# Patient Record
Sex: Female | Born: 1980
Health system: Southern US, Community
[De-identification: ages and names within clinical notes are randomized; demographics above are authoritative.]

## PROBLEM LIST (undated history)

## (undated) DIAGNOSIS — F419 Anxiety disorder, unspecified: Secondary | ICD-10-CM

## (undated) DIAGNOSIS — R030 Elevated blood-pressure reading, without diagnosis of hypertension: Secondary | ICD-10-CM

## (undated) DIAGNOSIS — D649 Anemia, unspecified: Secondary | ICD-10-CM

## (undated) DIAGNOSIS — T4145XA Adverse effect of unspecified anesthetic, initial encounter: Secondary | ICD-10-CM

## (undated) DIAGNOSIS — T8859XA Other complications of anesthesia, initial encounter: Secondary | ICD-10-CM

## (undated) DIAGNOSIS — R87629 Unspecified abnormal cytological findings in specimens from vagina: Secondary | ICD-10-CM

## (undated) DIAGNOSIS — T884XXA Failed or difficult intubation, initial encounter: Secondary | ICD-10-CM

## (undated) DIAGNOSIS — I10 Essential (primary) hypertension: Secondary | ICD-10-CM

## (undated) DIAGNOSIS — K219 Gastro-esophageal reflux disease without esophagitis: Secondary | ICD-10-CM

## (undated) DIAGNOSIS — K432 Incisional hernia without obstruction or gangrene: Secondary | ICD-10-CM

## (undated) DIAGNOSIS — R011 Cardiac murmur, unspecified: Secondary | ICD-10-CM

## (undated) HISTORY — DX: Anxiety disorder, unspecified: F41.9

## (undated) HISTORY — DX: Anemia, unspecified: D64.9

## (undated) HISTORY — PX: OTHER SURGICAL HISTORY: SHX169

## (undated) HISTORY — PX: HERNIA REPAIR: SHX51

## (undated) HISTORY — PX: UTERINE FIBROID SURGERY: SHX826

## (undated) HISTORY — PX: TONSILLECTOMY: SUR1361

---
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
## 2000-09-07 ENCOUNTER — Emergency Department (HOSPITAL_COMMUNITY): Admission: EM | Admit: 2000-09-07 | Discharge: 2000-09-07 | Payer: Self-pay | Admitting: Emergency Medicine

## 2001-01-24 ENCOUNTER — Encounter: Payer: Self-pay | Admitting: Emergency Medicine

## 2001-01-24 ENCOUNTER — Emergency Department (HOSPITAL_COMMUNITY): Admission: EM | Admit: 2001-01-24 | Discharge: 2001-01-24 | Payer: Self-pay | Admitting: Emergency Medicine

## 2007-01-15 ENCOUNTER — Emergency Department: Payer: Self-pay | Admitting: Emergency Medicine

## 2010-10-24 ENCOUNTER — Emergency Department: Payer: Self-pay | Admitting: Emergency Medicine

## 2011-03-12 ENCOUNTER — Emergency Department: Payer: Self-pay | Admitting: Emergency Medicine

## 2012-01-19 ENCOUNTER — Emergency Department: Payer: Self-pay | Admitting: Emergency Medicine

## 2012-01-19 LAB — RAPID INFLUENZA A&B ANTIGENS

## 2012-06-22 ENCOUNTER — Emergency Department: Payer: Self-pay | Admitting: Emergency Medicine

## 2013-01-31 ENCOUNTER — Emergency Department: Payer: Self-pay | Admitting: Emergency Medicine

## 2013-01-31 LAB — CBC
HCT: 41.1 % (ref 35.0–47.0)
HGB: 13.4 g/dL (ref 12.0–16.0)
MCH: 27.4 pg (ref 26.0–34.0)
MCHC: 32.6 g/dL (ref 32.0–36.0)
MCV: 84 fL (ref 80–100)
Platelet: 200 10*3/uL (ref 150–440)
RBC: 4.89 10*6/uL (ref 3.80–5.20)
RDW: 13.9 % (ref 11.5–14.5)
WBC: 6 10*3/uL (ref 3.6–11.0)

## 2013-01-31 LAB — COMPREHENSIVE METABOLIC PANEL
Albumin: 3.6 g/dL (ref 3.4–5.0)
Alkaline Phosphatase: 59 U/L
Anion Gap: 5 — ABNORMAL LOW (ref 7–16)
BUN: 8 mg/dL (ref 7–18)
Bilirubin,Total: 0.4 mg/dL (ref 0.2–1.0)
Calcium, Total: 8.7 mg/dL (ref 8.5–10.1)
Chloride: 105 mmol/L (ref 98–107)
Co2: 27 mmol/L (ref 21–32)
Creatinine: 1 mg/dL (ref 0.60–1.30)
EGFR (African American): >60
EGFR (Non-African Amer.): >60
Glucose: 94 mg/dL (ref 65–99)
Osmolality: 272 (ref 275–301)
Potassium: 4 mmol/L (ref 3.5–5.1)
SGOT(AST): 39 U/L — ABNORMAL HIGH (ref 15–37)
SGPT (ALT): 40 U/L (ref 12–78)
Sodium: 137 mmol/L (ref 136–145)
Total Protein: 7.8 g/dL (ref 6.4–8.2)

## 2013-01-31 LAB — TROPONIN I: Troponin-I: <0.02 ng/mL

## 2013-01-31 LAB — CK TOTAL AND CKMB (NOT AT ARMC)
CK, Total: 160 U/L (ref 21–215)
CK-MB: 1.4 ng/mL (ref 0.5–3.6)

## 2013-07-08 ENCOUNTER — Emergency Department: Payer: Self-pay | Admitting: Emergency Medicine

## 2013-11-19 ENCOUNTER — Emergency Department: Payer: Self-pay | Admitting: Emergency Medicine

## 2013-11-19 LAB — CBC WITH DIFFERENTIAL/PLATELET
Basophil #: 0.1 10*3/uL (ref 0.0–0.1)
Basophil %: 1.1 %
Eosinophil #: 0.2 10*3/uL (ref 0.0–0.7)
Eosinophil %: 3.1 %
HCT: 35.4 % (ref 35.0–47.0)
HGB: 11 g/dL — ABNORMAL LOW (ref 12.0–16.0)
Lymphocyte #: 2.1 10*3/uL (ref 1.0–3.6)
Lymphocyte %: 38.4 %
MCH: 25.3 pg — ABNORMAL LOW (ref 26.0–34.0)
MCHC: 31.1 g/dL — ABNORMAL LOW (ref 32.0–36.0)
MCV: 81 fL (ref 80–100)
Monocyte #: 0.5 x10 3/mm (ref 0.2–0.9)
Monocyte %: 9.4 %
Neutrophil #: 2.7 10*3/uL (ref 1.4–6.5)
Neutrophil %: 48 %
Platelet: 250 10*3/uL (ref 150–440)
RBC: 4.35 10*6/uL (ref 3.80–5.20)
RDW: 15.1 % — ABNORMAL HIGH (ref 11.5–14.5)
WBC: 5.6 10*3/uL (ref 3.6–11.0)

## 2013-11-19 LAB — COMPREHENSIVE METABOLIC PANEL
Albumin: 3.4 g/dL (ref 3.4–5.0)
Alkaline Phosphatase: 50 U/L
Anion Gap: 6 — ABNORMAL LOW (ref 7–16)
BUN: 9 mg/dL (ref 7–18)
Bilirubin,Total: 0.3 mg/dL (ref 0.2–1.0)
Calcium, Total: 8.3 mg/dL — ABNORMAL LOW (ref 8.5–10.1)
Chloride: 108 mmol/L — ABNORMAL HIGH (ref 98–107)
Co2: 27 mmol/L (ref 21–32)
Creatinine: 1 mg/dL (ref 0.60–1.30)
EGFR (African American): >60
EGFR (Non-African Amer.): >60
Glucose: 109 mg/dL — ABNORMAL HIGH (ref 65–99)
Osmolality: 281 (ref 275–301)
Potassium: 4.3 mmol/L (ref 3.5–5.1)
SGOT(AST): 25 U/L (ref 15–37)
SGPT (ALT): 34 U/L
Sodium: 141 mmol/L (ref 136–145)
Total Protein: 7.3 g/dL (ref 6.4–8.2)

## 2013-11-19 LAB — URINALYSIS, COMPLETE
Bacteria: NONE SEEN
Bilirubin,UR: NEGATIVE
Blood: NEGATIVE
Glucose,UR: NEGATIVE mg/dL (ref 0–75)
Ketone: NEGATIVE
Leukocyte Esterase: NEGATIVE
Nitrite: NEGATIVE
Ph: 6 (ref 4.5–8.0)
Protein: NEGATIVE
RBC,UR: NONE SEEN /HPF (ref 0–5)
Specific Gravity: 1.001 (ref 1.003–1.030)
Squamous Epithelial: 1
WBC UR: NONE SEEN /HPF (ref 0–5)

## 2014-02-18 ENCOUNTER — Emergency Department: Payer: Self-pay | Admitting: Emergency Medicine

## 2014-03-28 ENCOUNTER — Emergency Department: Payer: Self-pay | Admitting: Emergency Medicine

## 2014-06-04 DIAGNOSIS — D251 Intramural leiomyoma of uterus: Secondary | ICD-10-CM | POA: Insufficient documentation

## 2014-12-31 ENCOUNTER — Encounter: Payer: Self-pay | Admitting: Emergency Medicine

## 2014-12-31 ENCOUNTER — Emergency Department: Payer: Self-pay

## 2014-12-31 ENCOUNTER — Emergency Department
Admission: EM | Admit: 2014-12-31 | Discharge: 2014-12-31 | Disposition: A | Discharge status: Home / Self Care | Payer: Self-pay | Attending: Emergency Medicine | Admitting: Emergency Medicine

## 2014-12-31 DIAGNOSIS — M25511 Pain in right shoulder: Secondary | ICD-10-CM

## 2014-12-31 DIAGNOSIS — W010XXA Fall on same level from slipping, tripping and stumbling without subsequent striking against object, initial encounter: Secondary | ICD-10-CM | POA: Insufficient documentation

## 2014-12-31 DIAGNOSIS — F17210 Nicotine dependence, cigarettes, uncomplicated: Secondary | ICD-10-CM | POA: Insufficient documentation

## 2014-12-31 DIAGNOSIS — Y998 Other external cause status: Secondary | ICD-10-CM | POA: Insufficient documentation

## 2014-12-31 DIAGNOSIS — S4991XA Unspecified injury of right shoulder and upper arm, initial encounter: Secondary | ICD-10-CM | POA: Insufficient documentation

## 2014-12-31 DIAGNOSIS — Y9289 Other specified places as the place of occurrence of the external cause: Secondary | ICD-10-CM | POA: Insufficient documentation

## 2014-12-31 DIAGNOSIS — Y9389 Activity, other specified: Secondary | ICD-10-CM | POA: Insufficient documentation

## 2014-12-31 MED ORDER — OXYCODONE-ACETAMINOPHEN 5-325 MG PO TABS: 1.0000 | ORAL_TABLET | ORAL | Status: DC | PRN

## 2014-12-31 MED ORDER — OXYCODONE-ACETAMINOPHEN 5-325 MG PO TABS
1.0000 | ORAL_TABLET | Freq: Once | ORAL | Status: DC
  Filled 2014-12-31: qty 1

## 2014-12-31 MED ORDER — IBUPROFEN 800 MG PO TABS
800.0000 mg | ORAL_TABLET | Freq: Once | ORAL | Status: AC
  Administered 2014-12-31: 800 mg via ORAL

## 2014-12-31 MED ORDER — IBUPROFEN 800 MG PO TABS
ORAL_TABLET | ORAL | Status: AC
  Administered 2014-12-31: 800 mg via ORAL
  Filled 2014-12-31: qty 1

## 2014-12-31 NOTE — ED Provider Notes (Signed)
[facility] Emergency Department Provider Note  ____________________________________________  Time seen: 6:45 AM  I have reviewed the triage vital signs and the nursing notes.   HISTORY  Chief Complaint No chief complaint on file.     HPI Laura Benton is a 34 y.o. female presents with history of accidental slip and fall at midnight last night resulting in immediate onset of right shoulder pain. Patient states "I felt it pop out and then back in again. Patient states that her current pain score is 7 out of 10 worse and with right shoulder movement. She denies any head injury no loss of consciousness.     Past medical history Uterine fibroid Right shoulder dislocation There are no active problems to display for this patient.   Past Surgical History  Procedure Laterality Date  . Right shoulder surgery    . Uterine fibroid surgery      Current Outpatient Rx  Name  Route  Sig  Dispense  Refill  . oxyCODONE-acetaminophen (PERCOCET/ROXICET) 5-325 MG tablet   Oral   Take 1 tablet by mouth every 4 (four) hours as needed for severe pain.   20 tablet   0     Allergies No known drug allergies No family history on file.  Social History Social History  Substance Use Topics  . Smoking status: Current Some Day Smoker    Types: Cigarettes  . Smokeless tobacco: None  . Alcohol Use: No    Review of Systems  Constitutional: Negative for fever. Eyes: Negative for visual changes. ENT: Negative for sore throat. Cardiovascular: Negative for chest pain. Respiratory: Negative for shortness of breath. Gastrointestinal: Negative for abdominal pain, vomiting and diarrhea. Genitourinary: Negative for dysuria. Musculoskeletal: Negative for back pain. Positive for right shoulder pain Skin: Negative for rash. Neurological: Negative for headaches, focal weakness or numbness.   10-point ROS otherwise  negative.  ____________________________________________   PHYSICAL EXAM:  VITAL SIGNS: ED Triage Vitals  Enc Vitals Group     BP 12/31/14 0514 131/88 mmHg     Pulse Rate 12/31/14 0514 88     Resp 12/31/14 0514 20     Temp 12/31/14 0514 97.9 F (36.6 C)     Temp Source 12/31/14 0514 Oral     SpO2 12/31/14 0514 97 %     Weight 12/31/14 0514 235 lb (106.595 kg)     Height 12/31/14 0514 5\' 5"  (1.651 m)     Head Cir --      Peak Flow --      Pain Score 12/31/14 0512 5     Pain Loc --      Pain Edu? --      Excl. in GC? --      Constitutional: Alert and oriented. Apparent discomfort Eyes: Conjunctivae are normal. PERRL. Normal extraocular movements. ENT   Head: Normocephalic and atraumatic.   Nose: No congestion/rhinnorhea.   Mouth/Throat: Mucous membranes are moist.   Neck: No stridor. Hematological/Lymphatic/Immunilogical: No cervical lymphadenopathy. Cardiovascular: Normal rate, regular rhythm. Normal and symmetric distal pulses are present in all extremities. No murmurs, rubs, or gallops. Respiratory: Normal respiratory effort without tachypnea nor retractions. Breath sounds are clear and equal bilaterally. No wheezes/rales/rhonchi. Gastrointestinal: Soft and nontender. No distention. There is no CVA tenderness. Genitourinary: deferred Musculoskeletal: Pain with active and passive ROM. No joint effusions.  No lower extremity tenderness nor edema. Neurologic:  Normal speech and language. No gross focal neurologic deficits are appreciated. Speech is normal.  Skin:  Skin is  warm, dry and intact. No rash noted. Psychiatric: Mood and affect are normal. Speech and behavior are normal. Patient exhibits appropriate insight and judgment.    RADIOLOGY     DG Shoulder Right (Final result) Result time: 12/31/14 06:22:43   Final result by Rad Results In Interface (12/31/14 06:22:43)   Narrative:   CLINICAL DATA: Initial evaluation for acute trauma,  fall.  EXAM: RIGHT SHOULDER - 2+ VIEW  COMPARISON: None.  FINDINGS: No acute fracture or dislocation. Humeral head in normal alignment with the glenoid. AC joint approximated. Mild flattening at the superolateral aspect of the humeral head, which may related to prior dislocation. No periarticular calcification. Soft tissues within normal limits.  IMPRESSION: No acute fracture or dislocation.   Electronically Signed By: Rise Mu M.D. On: 12/31/2014 06:22        ECG Results       INITIAL IMPRESSION / ASSESSMENT AND PLAN / ED COURSE  Pertinent labs & imaging results that were available during my care of the patient were reviewed by me and considered in my medical decision making (see chart for details).  Concern for possible rotator cuff injury as such patient is being referred to Dr. Hyacinth Meeker orthopedic surgeon on-call for further evaluation and management on the outpatient setting  ____________________________________________   FINAL CLINICAL IMPRESSION(S) / ED DIAGNOSES  Final diagnoses:  Acute shoulder pain, right      Darci Current, MD 12/31/14 2312

## 2014-12-31 NOTE — Discharge Instructions (Signed)
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
## 2015-04-07 ENCOUNTER — Encounter: Payer: Self-pay | Admitting: Medical Oncology

## 2015-04-07 ENCOUNTER — Emergency Department
Admission: EM | Admit: 2015-04-07 | Discharge: 2015-04-07 | Disposition: A | Discharge status: Home / Self Care | Payer: Self-pay | Attending: Emergency Medicine | Admitting: Emergency Medicine

## 2015-04-07 ENCOUNTER — Emergency Department: Payer: Self-pay

## 2015-04-07 DIAGNOSIS — F17210 Nicotine dependence, cigarettes, uncomplicated: Secondary | ICD-10-CM | POA: Insufficient documentation

## 2015-04-07 DIAGNOSIS — R112 Nausea with vomiting, unspecified: Secondary | ICD-10-CM | POA: Insufficient documentation

## 2015-04-07 DIAGNOSIS — R1031 Right lower quadrant pain: Secondary | ICD-10-CM | POA: Insufficient documentation

## 2015-04-07 DIAGNOSIS — Z3202 Encounter for pregnancy test, result negative: Secondary | ICD-10-CM | POA: Insufficient documentation

## 2015-04-07 DIAGNOSIS — N939 Abnormal uterine and vaginal bleeding, unspecified: Secondary | ICD-10-CM | POA: Insufficient documentation

## 2015-04-07 LAB — CBC
HEMATOCRIT: 40.2 % (ref 35.0–47.0)
Hemoglobin: 13 g/dL (ref 12.0–16.0)
MCH: 27.3 pg (ref 26.0–34.0)
MCHC: 32.4 g/dL (ref 32.0–36.0)
MCV: 84.3 fL (ref 80.0–100.0)
Platelets: 253 10*3/uL (ref 150–440)
RBC: 4.76 MIL/uL (ref 3.80–5.20)
RDW: 16.5 % — AB (ref 11.5–14.5)
WBC: 5.8 10*3/uL (ref 3.6–11.0)

## 2015-04-07 LAB — URINALYSIS COMPLETE WITH MICROSCOPIC (ARMC ONLY)
BACTERIA UA: NONE SEEN
Bilirubin Urine: NEGATIVE
GLUCOSE, UA: NEGATIVE mg/dL
KETONES UR: NEGATIVE mg/dL
LEUKOCYTES UA: NEGATIVE
NITRITE: NEGATIVE
PROTEIN: 100 mg/dL — AB
RBC / HPF: NONE SEEN RBC/hpf (ref 0–5)
SPECIFIC GRAVITY, URINE: 1.011 (ref 1.005–1.030)
pH: 6 (ref 5.0–8.0)

## 2015-04-07 LAB — COMPREHENSIVE METABOLIC PANEL
ALK PHOS: 46 U/L (ref 38–126)
ALT: 24 U/L (ref 14–54)
ANION GAP: 7 (ref 5–15)
AST: 35 U/L (ref 15–41)
Albumin: 3.9 g/dL (ref 3.5–5.0)
BILIRUBIN TOTAL: 0.6 mg/dL (ref 0.3–1.2)
BUN: 12 mg/dL (ref 6–20)
CO2: 26 mmol/L (ref 22–32)
Calcium: 9.3 mg/dL (ref 8.9–10.3)
Chloride: 103 mmol/L (ref 101–111)
Creatinine, Ser: 1.02 mg/dL — ABNORMAL HIGH (ref 0.44–1.00)
GFR calc Af Amer: >60 mL/min (ref >60)
Glucose, Bld: 103 mg/dL — ABNORMAL HIGH (ref 65–99)
POTASSIUM: 4 mmol/L (ref 3.5–5.1)
Sodium: 136 mmol/L (ref 135–145)
TOTAL PROTEIN: 7.1 g/dL (ref 6.5–8.1)

## 2015-04-07 LAB — LIPASE, BLOOD: Lipase: 19 U/L (ref 11–51)

## 2015-04-07 LAB — POCT PREGNANCY, URINE: Preg Test, Ur: NEGATIVE

## 2015-04-07 MED ORDER — MORPHINE SULFATE (PF) 4 MG/ML IV SOLN
INTRAVENOUS | Status: AC
  Filled 2015-04-07: qty 1

## 2015-04-07 MED ORDER — ONDANSETRON HCL 4 MG/2ML IJ SOLN
INTRAMUSCULAR | Status: AC
  Filled 2015-04-07: qty 2

## 2015-04-07 MED ORDER — MORPHINE SULFATE (PF) 4 MG/ML IV SOLN
4.0000 mg | Freq: Once | INTRAVENOUS | Status: AC
  Administered 2015-04-07: 4 mg via INTRAVENOUS

## 2015-04-07 MED ORDER — ONDANSETRON HCL 4 MG/2ML IJ SOLN
4.0000 mg | Freq: Once | INTRAMUSCULAR | Status: AC
  Administered 2015-04-07: 4 mg via INTRAVENOUS

## 2015-04-07 MED ORDER — DICYCLOMINE HCL 20 MG PO TABS: 20.0000 mg | ORAL_TABLET | Freq: Three times a day (TID) | ORAL | Status: DC | PRN

## 2015-04-07 MED ORDER — IOHEXOL 240 MG/ML SOLN
25.0000 mL | Freq: Once | INTRAMUSCULAR | Status: AC | PRN
  Administered 2015-04-07: 25 mL via ORAL
  Filled 2015-04-07: qty 25

## 2015-04-07 MED ORDER — IOHEXOL 350 MG/ML SOLN
125.0000 mL | Freq: Once | INTRAVENOUS | Status: AC | PRN
  Administered 2015-04-07: 125 mL via INTRAVENOUS
  Filled 2015-04-07: qty 125

## 2015-04-07 MED ORDER — ONDANSETRON HCL 4 MG PO TABS: 4.0000 mg | ORAL_TABLET | Freq: Every day | ORAL | Status: DC | PRN

## 2015-04-07 MED ORDER — SODIUM CHLORIDE 0.9 % IV BOLUS (SEPSIS)
1000.0000 mL | Freq: Once | INTRAVENOUS | Status: AC
  Administered 2015-04-07: 1000 mL via INTRAVENOUS

## 2015-04-07 NOTE — ED Notes (Signed)
Patient with no complaints at this time. Respirations even and unlabored. Skin warm/dry. Discharge instructions reviewed with patient at this time. Patient given opportunity to voice concerns/ask questions. IV removed per policy and band-aid applied to site. Patient discharged at this time and left Emergency Department with steady gait.  

## 2015-04-07 NOTE — ED Notes (Signed)
Pt c/o rt lower abd pain that began 1 week ago, has continued to worsen, pt denies dysuria. Pt reports vomit x 1 but no diarrhea. Pt also denies any abnormal vaginal bleeding.

## 2015-04-07 NOTE — ED Provider Notes (Signed)
[facility] Emergency Department Provider Note  ____________________________________________  Time seen: Approximately 645 PM  I have reviewed the triage vital signs and the nursing notes.   HISTORY  Chief Complaint Abdominal Pain    HPI Laura Benton is a 35 y.o. female with right lower quadrant abdominal pain which is been increasing over the past week. The pain is 5 out of 10 at this time. It is been associated with nausea and vomiting. The patient describes it as sharp and radiating to her anterior right leg. She denies any burning or frequency with urination. Denies any diarrhea. Denies any vaginal discharge. Says that she is finishing her menses and is therefore having a small amount of vaginal bleeding at this time.   History reviewed. No pertinent past medical history.  There are no active problems to display for this patient.   Past Surgical History  Procedure Laterality Date  . Right shoulder surgery    . Uterine fibroid surgery      Current Outpatient Rx  Name  Route  Sig  Dispense  Refill  . oxyCODONE-acetaminophen (PERCOCET/ROXICET) 5-325 MG tablet   Oral   Take 1 tablet by mouth every 4 (four) hours as needed for severe pain.   20 tablet   0     Allergies Review of patient's allergies indicates no known allergies.  No family history on file.  Social History Social History  Substance Use Topics  . Smoking status: Current Some Day Smoker    Types: Cigarettes  . Smokeless tobacco: None  . Alcohol Use: No    Review of Systems Constitutional: No fever/chills Eyes: No visual changes. ENT: No sore throat. Cardiovascular: Denies chest pain. Respiratory: Denies shortness of breath. Gastrointestinal:  No diarrhea.  No constipation. Genitourinary: Negative for dysuria. Musculoskeletal: Negative for back pain. Skin: Negative for rash. Neurological: Negative for headaches, focal weakness or numbness.  10-point ROS  otherwise negative.  ____________________________________________   PHYSICAL EXAM:  VITAL SIGNS: ED Triage Vitals  Enc Vitals Group     BP 04/07/15 1726 178/105 mmHg     Pulse Rate 04/07/15 1726 88     Resp 04/07/15 1726 18     Temp 04/07/15 1726 98.2 F (36.8 C)     Temp Source 04/07/15 1726 Oral     SpO2 04/07/15 1726 99 %     Weight 04/07/15 1726 245 lb (111.131 kg)     Height 04/07/15 1726  (1.651 m)     Head Cir --      Peak Flow --      Pain Score 04/07/15 1731 5     Pain Loc --      Pain Edu? --      Excl. in GC? --     Constitutional: Alert and oriented. Well appearing and in no acute distress. Eyes: Conjunctivae are normal. PERRL. EOMI. Head: Atraumatic. Nose: No congestion/rhinnorhea. Mouth/Throat: Mucous membranes are moist.  Neck: No stridor.   Cardiovascular: Normal rate, regular rhythm. Grossly normal heart sounds.  Good peripheral circulation. Respiratory: Normal respiratory effort.  No retractions. Lungs CTAB. Gastrointestinal: Soft with mild-to-moderate right lower quadrant abdominal tenderness. There is no rebound or guarding. No mass along the inguinal right ligament. No distention. No abdominal bruits. No CVA tenderness. Musculoskeletal: No lower extremity tenderness nor edema.  No joint effusions. Neurologic:  Normal speech and language. No gross focal neurologic deficits are appreciated. No gait instability. Skin:  Skin is warm, dry and intact. No rash noted.  Psychiatric: Mood and affect are normal. Speech and behavior are normal.  ____________________________________________   LABS (all labs ordered are listed, but only abnormal results are displayed)  Labs Reviewed  COMPREHENSIVE METABOLIC PANEL - Abnormal; Notable for the following:    Glucose, Bld 103 (*)    Creatinine, Ser 1.02 (*)    All other components within normal limits  CBC - Abnormal; Notable for the following:    RDW 16.5 (*)    All other components within normal limits   URINALYSIS COMPLETEWITH MICROSCOPIC ([facility] ONLY) - Abnormal; Notable for the following:    Color, Urine YELLOW (*)    APPearance CLEAR (*)    Hgb urine dipstick 2+ (*)    Protein, ur 100 (*)    Squamous Epithelial / LPF 0-5 (*)    All other components within normal limits  LIPASE, BLOOD  POC URINE PREG, ED  POCT PREGNANCY, URINE   ____________________________________________  EKG   ____________________________________________  RADIOLOGY  IMPRESSION: 1. No acute findings. No bowel obstruction. No free fluid. Adnexal regions are unremarkable. Appendix is normal. 2. Probable fatty infiltration of the liver.   Electronically Signed By: Bary RichardStan Maynard M.D. On: 04/07/2015 19:48       ____________________________________________   PROCEDURES    ____________________________________________   INITIAL IMPRESSION / ASSESSMENT AND PLAN / ED COURSE  Pertinent labs & imaging results that were available during my care of the patient were reviewed by me and considered in my medical decision making (see chart for details).  ----------------------------------------- 8:14 PM on 04/07/2015 -----------------------------------------  Patient with pain and nausea improved after medication. Reviewed labs as well as imaging with the patient as well as her husband who is at bedside. The patient was specifically concerned about her appendix as well as fibroids which she has had removed in the past. We reviewed the CAT scan report in detail. It is possible this is been caused by virus. The patient also reported that she drinks heavily. She says she does not know how many mixed drinks she drinks per day. Despite this, her liver labs appear normal. We did discuss reducing her amount of drinking and how this may help with her overall health and the long-term. She understands this and is willing to comply. Will follow-up with her family physician in [place] within one week. Nose return  for any worsening or concerning symptoms. We did discuss return precautions. ____________________________________________   FINAL CLINICAL IMPRESSION(S) / ED DIAGNOSES  Right lower quadrant abdominal pain. Vomiting and nausea.    Myrna Blazeravid Matthew Marni Franzoni, MD 04/07/15 2015

## 2015-04-07 NOTE — Discharge Instructions (Signed)
Abdominal Pain, Adult °Many things can cause abdominal pain. Usually, abdominal pain is not caused by a disease and will improve without treatment. It can often be observed and treated at home. Your health care provider will do a physical exam and possibly order blood tests and X-rays to help determine the seriousness of your pain. However, in many cases, more time must pass before a clear cause of the pain can be found. Before that point, your health care provider may not know if you need more testing or further treatment. °HOME CARE INSTRUCTIONS °Monitor your abdominal pain for any changes. The following actions may help to alleviate any discomfort you are experiencing: °· Only take over-the-counter or prescription medicines as directed by your health care provider. °· Do not take laxatives unless directed to do so by your health care provider. °· Try a clear liquid diet (broth, tea, or water) as directed by your health care provider. Slowly move to a bland diet as tolerated. °SEEK MEDICAL CARE IF: °· You have unexplained abdominal pain. °· You have abdominal pain associated with nausea or diarrhea. °· You have pain when you urinate or have a bowel movement. °· You experience abdominal pain that wakes you in the night. °· You have abdominal pain that is worsened or improved by eating food. °· You have abdominal pain that is worsened with eating fatty foods. °· You have a fever. °SEEK IMMEDIATE MEDICAL CARE IF: °· Your pain does not go away within 2 hours. °· You keep throwing up (vomiting). °· Your pain is felt only in portions of the abdomen, such as the right side or the left lower portion of the abdomen. °· You pass bloody or black tarry stools. °MAKE SURE YOU: °· Understand these instructions. °· Will watch your condition. °· Will get help right away if you are not doing well or get worse. °  °This information is not intended to replace advice given to you by your health care provider. Make sure you discuss  any questions you have with your health care provider. °  °Document Released: 10/21/2004 Document Revised: 10/02/2014 Document Reviewed: 09/20/2012 °Elsevier Interactive Patient Education ©2016 [employer] ° °Nausea and Vomiting °Nausea means you feel sick to your stomach. Throwing up (vomiting) is a reflex where stomach contents come out of your mouth. °HOME CARE  °· Take medicine as told by your doctor. °· Do not force yourself to eat. However, you do need to drink fluids. °· If you feel like eating, eat a normal diet as told by your doctor. °¨ Eat rice, wheat, potatoes, bread, lean meats, yogurt, fruits, and vegetables. °¨ Avoid high-fat foods. °· Drink enough fluids to keep your pee (urine) clear or pale yellow. °· Ask your doctor how to replace body fluid losses (rehydrate). Signs of body fluid loss (dehydration) include: °¨ Feeling very thirsty. °¨ Dry lips and mouth. °¨ Feeling dizzy. °¨ Dark pee. °¨ Peeing less than normal. °¨ Feeling confused. °¨ Fast breathing or heart rate. °GET HELP RIGHT AWAY IF:  °· You have blood in your throw up. °· You have black or bloody poop (stool). °· You have a bad headache or stiff neck. °· You feel confused. °· You have bad belly (abdominal) pain. °· You have chest pain or trouble breathing. °· You do not pee at least once every 8 hours. °· You have cold, clammy skin. °· You keep throwing up after 24 to 48 hours. °· You have a fever. °MAKE SURE YOU:  °·   Understand these instructions. °· Will watch your condition. °· Will get help right away if you are not doing well or get worse. °  °This information is not intended to replace advice given to you by your health care provider. Make sure you discuss any questions you have with your health care provider. °  °Document Released: 06/30/2007 Document Revised: 04/05/2011 Document Reviewed: 06/12/2010 °Elsevier Interactive Patient Education ©2016 [employer] Inc[employer] ° °

## 2017-02-03 ENCOUNTER — Emergency Department
Admission: EM | Admit: 2017-02-03 | Discharge: 2017-02-03 | Disposition: A | Discharge status: Home / Self Care | Payer: Medicaid Other | Attending: Emergency Medicine | Admitting: Emergency Medicine

## 2017-02-03 ENCOUNTER — Emergency Department: Payer: Medicaid Other

## 2017-02-03 ENCOUNTER — Encounter: Payer: Self-pay | Admitting: Emergency Medicine

## 2017-02-03 DIAGNOSIS — Y999 Unspecified external cause status: Secondary | ICD-10-CM | POA: Diagnosis not present

## 2017-02-03 DIAGNOSIS — Y939 Activity, unspecified: Secondary | ICD-10-CM | POA: Diagnosis not present

## 2017-02-03 DIAGNOSIS — Y929 Unspecified place or not applicable: Secondary | ICD-10-CM | POA: Insufficient documentation

## 2017-02-03 DIAGNOSIS — Z3400 Encounter for supervision of normal first pregnancy, unspecified trimester: Secondary | ICD-10-CM | POA: Insufficient documentation

## 2017-02-03 DIAGNOSIS — S63259A Unspecified dislocation of unspecified finger, initial encounter: Secondary | ICD-10-CM

## 2017-02-03 DIAGNOSIS — Z331 Pregnant state, incidental: Secondary | ICD-10-CM | POA: Diagnosis not present

## 2017-02-03 DIAGNOSIS — S63282A Dislocation of proximal interphalangeal joint of right middle finger, initial encounter: Secondary | ICD-10-CM | POA: Insufficient documentation

## 2017-02-03 DIAGNOSIS — S63252A Unspecified dislocation of right middle finger, initial encounter: Secondary | ICD-10-CM | POA: Diagnosis present

## 2017-02-03 LAB — HCG, QUANTITATIVE, PREGNANCY: hCG, Beta Chain, Quant, S: 111264 m[IU]/mL — ABNORMAL HIGH (ref <5)

## 2017-02-03 LAB — POCT PREGNANCY, URINE: PREG TEST UR: POSITIVE — AB

## 2017-02-03 MED ORDER — LIDOCAINE HCL (PF) 1 % IJ SOLN
INTRAMUSCULAR | Status: AC
  Administered 2017-02-03: 5 mL via INTRADERMAL
  Filled 2017-02-03: qty 5

## 2017-02-03 MED ORDER — LIDOCAINE HCL 1 % IJ SOLN
5.0000 mL | Freq: Once | INTRAMUSCULAR | Status: AC
  Administered 2017-02-03: 5 mL via INTRADERMAL
  Filled 2017-02-03: qty 5
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
## 2017-06-10 ENCOUNTER — Encounter: Payer: Self-pay | Admitting: *Deleted

## 2017-06-10 ENCOUNTER — Observation Stay
Admission: EM | Admit: 2017-06-10 | Discharge: 2017-06-10 | Disposition: A | Discharge status: Home / Self Care | Payer: Medicaid Other | Attending: Certified Nurse Midwife | Admitting: Certified Nurse Midwife

## 2017-06-10 DIAGNOSIS — Z3A31 31 weeks gestation of pregnancy: Secondary | ICD-10-CM | POA: Insufficient documentation

## 2017-06-10 DIAGNOSIS — Z87891 Personal history of nicotine dependence: Secondary | ICD-10-CM | POA: Insufficient documentation

## 2017-06-10 DIAGNOSIS — R109 Unspecified abdominal pain: Secondary | ICD-10-CM | POA: Diagnosis not present

## 2017-06-10 DIAGNOSIS — O26893 Other specified pregnancy related conditions, third trimester: Principal | ICD-10-CM | POA: Insufficient documentation

## 2017-06-10 DIAGNOSIS — Z79899 Other long term (current) drug therapy: Secondary | ICD-10-CM | POA: Insufficient documentation

## 2017-06-10 HISTORY — DX: Unspecified abnormal cytological findings in specimens from vagina: R87.629

## 2017-06-10 LAB — URINALYSIS, COMPLETE (UACMP) WITH MICROSCOPIC
BACTERIA UA: NONE SEEN
Bilirubin Urine: NEGATIVE
Glucose, UA: NEGATIVE mg/dL
Hgb urine dipstick: NEGATIVE
Ketones, ur: NEGATIVE mg/dL
Leukocytes, UA: NEGATIVE
Nitrite: NEGATIVE
PH: 6 (ref 5.0–8.0)
Protein, ur: NEGATIVE mg/dL
SPECIFIC GRAVITY, URINE: 1.018 (ref 1.005–1.030)

## 2017-06-10 MED ORDER — ACETAMINOPHEN 325 MG PO TABS: 650.0000 mg | ORAL_TABLET | ORAL | Status: DC | PRN

## 2017-06-10 NOTE — Discharge Summary (Signed)
Laura Benton Shingles is a 37 y.o. female. She is at [redacted]w[redacted]d gestation. Patient's last menstrual period was 10/27/2016 (approximate). Estimated Date of Delivery: 08/06/17  Prenatal care site: [facility]  Chief complaint: abdominal pain  Location: central abdomen, near umbilicus Onset/timing: 0500 this morning Duration: constant Quality: cramping Severity: 5/10 Aggravating or alleviating conditions: none Associated signs/symptoms: mild nausea, no emesis Context: Laura Benton reports a cramping abdominal pain that woke her up this morning around 5am. She describes it as feeling like a menstrual cramp. Nothing seemed to make it better or worse and she did not do anything to try and treat the pain at home. She had been feeling somewhat nauseous when the pain first started, but she did not throw up and she is not feeling nauseous anymore. She reports no dysuria, no flank or back pain, and no urinary urgency.   S: Resting comfortably.   She reports:  -active fetal movement -no leakage of fluid  -no vaginal bleeding -no contractions  Maternal Medical History:   Past Medical History:  Diagnosis Date  . Vaginal Pap smear, abnormal     Past Surgical History:  Procedure Laterality Date  . right shoulder surgery    . UTERINE FIBROID SURGERY      No Known Allergies  Prior to Admission medications   Medication Sig Start Date End Date Taking? Authorizing Provider  ferrous sulfate 325 (65 FE) MG EC tablet Take 325 mg by mouth 3 (three) times daily with meals.   Yes [provider]  Prenatal Vit-Fe Fumarate-FA (PRENATAL MULTIVITAMIN) TABS tablet Take 1 tablet by mouth daily at 12 noon.   Yes [provider]  dicyclomine (BENTYL) 20 MG tablet Take 1 tablet (20 mg total) by mouth 3 (three) times daily as needed for spasms. 04/07/15 04/06/16  Myrna Blazer, MD  ondansetron (ZOFRAN) 4 MG tablet Take 1 tablet (4 mg total) by mouth daily as needed. 04/07/15   Schaevitz, Myra Rude, MD  oxyCODONE-acetaminophen (PERCOCET/ROXICET) 5-325 MG tablet Take 1 tablet by mouth every 4 (four) hours as needed for severe pain. 12/31/14   Darci Current, MD     Social History: She  reports that she has quit smoking. Her smoking use included cigarettes. She has never used smokeless tobacco. She reports that she does not drink alcohol or use drugs.  Family History: family history includes Anemia in her sister; Pulmonary disease in her son; Throat cancer in her father.   Review of Systems: A full review of systems was performed and negative except as noted in the HPI.    O:  BP 122/67 (BP Location: Right Arm)   Pulse 81   Temp 97.9 F (36.6 C) (Oral)   Resp 18   Ht  (1.626 m)   Wt 110.2 kg (243 lb)   LMP 10/27/2016 (Approximate)   BMI 41.71 kg/m  No results found for this or any previous visit (from the past 48 hour(s)).   Constitutional: NAD, AAOx3  HE/ENT: extraocular movements grossly intact, moist mucous membranes CV: RRR PULM: nl respiratory effort, CTABL     Abd: gravid, non-tender, non-distended, soft  Back: symmetric, no CVAT     Ext: Non-tender, Nonedmeatous   Psych: mood appropriate, speech normal Pelvic deferred  UA: negative nitrites, negative leukocytes, negative bacteria  NST:  Baseline: 140bpm Variability: moderate Accelerations: present x >2, 10x10 and 15x15 Decelerations: absent Time:  A/P: 37 y.o. [redacted]w[redacted]d here for antenatal surveillance for abdominal pain  Labor: not present.  Fetal Wellbeing: Reactive NST, reassuring for gestational age.   UA negative.  Reviewed Braxton Hicks contractions, labor contractions, and musculoskeletal discomforts of pregnancy. Advised hydration, Tylenol PRN, and pregnancy support band for musculoskeletal discomforts.   Next appointment 06/23/17 at [facility].  D/c home stable, precautions reviewed, follow-up as scheduled.    Genia Del 06/10/2017 7:31 AM  ----- Genia Del,  CNM Certified Nurse Midwife [facility], Department of OB/GYN [facility]

## 2017-06-10 NOTE — OB Triage Note (Signed)
Discharged home. Sig other with patient. Next OB appt @ [facility] 06/23/17. Number to [facility] given per CNM request should pt have questions/concerns. Elaina Hoops

## 2017-06-10 NOTE — OB Triage Note (Signed)
Pt arrived from home with complaints of middle abdominal pain that pt states feels like a cramp that started around 5 am. Pt also reports some nausea. Pt denies any leaking of fluid or vaginal bleeding. Pt reports positive fetal movement.

## 2017-06-10 NOTE — Discharge Instructions (Signed)

## 2017-10-29 ENCOUNTER — Encounter (HOSPITAL_COMMUNITY): Payer: Self-pay

## 2017-11-04 ENCOUNTER — Emergency Department: Payer: Medicaid Other

## 2017-11-04 ENCOUNTER — Other Ambulatory Visit: Payer: Self-pay

## 2017-11-04 ENCOUNTER — Emergency Department
Admission: EM | Admit: 2017-11-04 | Discharge: 2017-11-04 | Disposition: A | Discharge status: Home / Self Care | Payer: Medicaid Other | Attending: Emergency Medicine | Admitting: Emergency Medicine

## 2017-11-04 DIAGNOSIS — R112 Nausea with vomiting, unspecified: Secondary | ICD-10-CM | POA: Insufficient documentation

## 2017-11-04 DIAGNOSIS — Z87891 Personal history of nicotine dependence: Secondary | ICD-10-CM | POA: Diagnosis not present

## 2017-11-04 DIAGNOSIS — K439 Ventral hernia without obstruction or gangrene: Secondary | ICD-10-CM

## 2017-11-04 DIAGNOSIS — R109 Unspecified abdominal pain: Secondary | ICD-10-CM | POA: Diagnosis present

## 2017-11-04 DIAGNOSIS — R197 Diarrhea, unspecified: Secondary | ICD-10-CM | POA: Diagnosis not present

## 2017-11-04 DIAGNOSIS — K529 Noninfective gastroenteritis and colitis, unspecified: Secondary | ICD-10-CM | POA: Insufficient documentation

## 2017-11-04 LAB — URINALYSIS, COMPLETE (UACMP) WITH MICROSCOPIC
BACTERIA UA: NONE SEEN
Bilirubin Urine: NEGATIVE
Glucose, UA: NEGATIVE mg/dL
Hgb urine dipstick: NEGATIVE
Ketones, ur: NEGATIVE mg/dL
LEUKOCYTES UA: NEGATIVE
Nitrite: NEGATIVE
PH: 6 (ref 5.0–8.0)
Protein, ur: 100 mg/dL — AB
SPECIFIC GRAVITY, URINE: 1.014 (ref 1.005–1.030)

## 2017-11-04 LAB — CBC WITH DIFFERENTIAL/PLATELET
ABS IMMATURE GRANULOCYTES: 0.02 10*3/uL (ref 0.00–0.07)
BASOS ABS: 0 10*3/uL (ref 0.0–0.1)
Basophils Relative: 1 %
EOS PCT: 3 %
Eosinophils Absolute: 0.1 10*3/uL (ref 0.0–0.5)
HEMATOCRIT: 42.3 % (ref 36.0–46.0)
HEMOGLOBIN: 13.8 g/dL (ref 12.0–15.0)
Immature Granulocytes: 0 %
LYMPHS ABS: 1.6 10*3/uL (ref 0.7–4.0)
LYMPHS PCT: 31 %
MCH: 27.4 pg (ref 26.0–34.0)
MCHC: 32.6 g/dL (ref 30.0–36.0)
MCV: 84.1 fL (ref 80.0–100.0)
Monocytes Absolute: 0.5 10*3/uL (ref 0.1–1.0)
Monocytes Relative: 9 %
NEUTROS ABS: 2.9 10*3/uL (ref 1.7–7.7)
NRBC: 0 % (ref 0.0–0.2)
Neutrophils Relative %: 56 %
Platelets: 216 10*3/uL (ref 150–400)
RBC: 5.03 MIL/uL (ref 3.87–5.11)
RDW: 14.8 % (ref 11.5–15.5)
WBC: 5.1 10*3/uL (ref 4.0–10.5)

## 2017-11-04 LAB — COMPREHENSIVE METABOLIC PANEL
ALBUMIN: 4 g/dL (ref 3.5–5.0)
ALK PHOS: 66 U/L (ref 38–126)
ALT: 15 U/L (ref 0–44)
ANION GAP: 10 (ref 5–15)
AST: 19 U/L (ref 15–41)
BILIRUBIN TOTAL: 0.7 mg/dL (ref 0.3–1.2)
BUN: 10 mg/dL (ref 6–20)
CALCIUM: 9.2 mg/dL (ref 8.9–10.3)
CO2: 29 mmol/L (ref 22–32)
CREATININE: 1.04 mg/dL — AB (ref 0.44–1.00)
Chloride: 103 mmol/L (ref 98–111)
GFR calc Af Amer: >60 mL/min (ref >60)
GFR calc non Af Amer: >60 mL/min (ref >60)
GLUCOSE: 117 mg/dL — AB (ref 70–99)
Potassium: 4.7 mmol/L (ref 3.5–5.1)
Sodium: 142 mmol/L (ref 135–145)
TOTAL PROTEIN: 7.6 g/dL (ref 6.5–8.1)

## 2017-11-04 LAB — POC URINE PREG, ED: PREG TEST UR: NEGATIVE

## 2017-11-04 MED ORDER — IOPAMIDOL (ISOVUE-300) INJECTION 61%
100.0000 mL | Freq: Once | INTRAVENOUS | Status: AC | PRN
  Administered 2017-11-04: 100 mL via INTRAVENOUS

## 2017-11-04 MED ORDER — OXYCODONE-ACETAMINOPHEN 5-325 MG PO TABS: 1.0000 | ORAL_TABLET | Freq: Three times a day (TID) | ORAL | 0 refills | Status: DC | PRN

## 2017-11-04 MED ORDER — SODIUM CHLORIDE 0.9 % IV SOLN
Freq: Once | INTRAVENOUS | Status: AC
  Administered 2017-11-04: 08:00:00 via INTRAVENOUS

## 2017-11-04 MED ORDER — ONDANSETRON HCL 4 MG/2ML IJ SOLN
4.0000 mg | Freq: Once | INTRAMUSCULAR | Status: AC
  Administered 2017-11-04: 4 mg via INTRAVENOUS
  Filled 2017-11-04: qty 2

## 2017-11-04 MED ORDER — ONDANSETRON 4 MG PO TBDP: 4.0000 mg | ORAL_TABLET | Freq: Three times a day (TID) | ORAL | 0 refills | Status: DC | PRN

## 2017-11-04 MED ORDER — MORPHINE SULFATE (PF) 2 MG/ML IV SOLN
INTRAVENOUS | Status: DC
Start: 2017-11-04 — End: 2017-11-04
  Filled 2017-11-04: qty 1

## 2017-11-04 MED ORDER — MORPHINE SULFATE (PF) 4 MG/ML IV SOLN
4.0000 mg | Freq: Once | INTRAVENOUS | Status: AC
  Administered 2017-11-04: 4 mg via INTRAVENOUS
  Filled 2017-11-04: qty 1

## 2017-11-04 NOTE — ED Triage Notes (Signed)
Pt c/o lower abd pain with N/V/D that started this morning. Pt c/o having abd pain since having a c section in june

## 2017-11-04 NOTE — ED Provider Notes (Signed)
[facility] Emergency Department Provider Note       Time seen: ----------------------------------------- 7:29 AM on 11/04/2017 -----------------------------------------   I have reviewed the triage vital signs and the nursing notes.  HISTORY   Chief Complaint Abdominal Pain    HPI Laura Benton is a 37 y.o. female with no significant past medical history who presents to the ED for abdominal pain with nausea, vomiting and diarrhea that started this morning.  Patient is also having abdominal pain since having a C-section in June.  She denies fevers, chills or other complaints.  Past Medical History:  Diagnosis Date  . Vaginal Pap smear, abnormal     Patient Active Problem List   Diagnosis Date Noted  . Abdominal pain 06/10/2017    Past Surgical History:  Procedure Laterality Date  . CESAREAN SECTION    . right shoulder surgery    . UTERINE FIBROID SURGERY      Allergies Patient has no known allergies.  Social History Social History   Tobacco Use  . Smoking status: Former Smoker    Types: Cigarettes  . Smokeless tobacco: Never Used  Substance Use Topics  . Alcohol use: No  . Drug use: Never   Review of Systems Constitutional: Negative for fever. Cardiovascular: Negative for chest pain. Respiratory: Negative for shortness of breath. Gastrointestinal: Positive for abdominal pain, vomiting and diarrhea Musculoskeletal: Negative for back pain. Skin: Negative for rash. Neurological: Negative for headaches, focal weakness or numbness.  All systems negative/normal/unremarkable except as stated in the HPI  ____________________________________________   PHYSICAL EXAM:  VITAL SIGNS: ED Triage Vitals  Enc Vitals Group     BP 11/04/17 0726 (!) 144/88     Pulse Rate 11/04/17 0726 73     Resp 11/04/17 0726 18     Temp 11/04/17 0726 97.7 F (36.5 C)     Temp Source 11/04/17 0726 Oral     SpO2 11/04/17 0726 97 %     Weight  11/04/17 0727 240 lb (108.9 kg)     Height 11/04/17 0727 5\' 5"  (1.651 m)     Head Circumference --      Peak Flow --      Pain Score 11/04/17 0726 3     Pain Loc --      Pain Edu? --      Excl. in GC? --    Constitutional: Alert and oriented.  Obese, no distress Eyes: Conjunctivae are normal. Normal extraocular movements. ENT   Head: Normocephalic and atraumatic.   Nose: No congestion/rhinnorhea.   Mouth/Throat: Mucous membranes are moist.   Neck: No stridor. Cardiovascular: Normal rate, regular rhythm. No murmurs, rubs, or gallops. Respiratory: Normal respiratory effort without tachypnea nor retractions. Breath sounds are clear and equal bilaterally. No wheezes/rales/rhonchi. Gastrointestinal: There is some nonfocal tenderness on the right side of the abdomen, no hernia is present.  Normal bowel sounds.  Previous C-section incision site is unremarkable Musculoskeletal: Nontender with normal range of motion in extremities. No lower extremity tenderness nor edema. Neurologic:  Normal speech and language. No gross focal neurologic deficits are appreciated.  Skin:  Skin is warm, dry and intact. No rash noted. Psychiatric: Mood and affect are normal. Speech and behavior are normal.  ____________________________________________  ED COURSE:  As part of my medical decision making, I reviewed the following data within the electronic MEDICAL RECORD NUMBER History obtained from family if available, nursing notes, old chart and ekg, as well as notes from prior ED visits. Patient  presented for abdominal pain with vomiting and diarrhea, we will assess with labs and imaging as indicated at this time.   Procedures ____________________________________________   LABS (pertinent positives/negatives)  Labs Reviewed  COMPREHENSIVE METABOLIC PANEL - Abnormal; Notable for the following components:      Result Value   Glucose, Bld 117 (*)    Creatinine, Ser 1.04 (*)    All other components  within normal limits  URINALYSIS, COMPLETE (UACMP) WITH MICROSCOPIC - Abnormal; Notable for the following components:   Color, Urine YELLOW (*)    APPearance CLEAR (*)    Protein, ur 100 (*)    All other components within normal limits  CBC WITH DIFFERENTIAL/PLATELET  POC URINE PREG, ED   IMPRESSION: 1. Ventral abdominal wall hernia RIGHT of midline below the umbilicus. The hernia sac is large measuring 10 cm and contains a long segment of nonobstructed small bowel. There is also fluid within the hernia sac which could indicate inflammation. No evidence of bowel obstruction currently. 2. Mild atherosclerotic calcification. ____________________________________________  DIFFERENTIAL DIAGNOSIS   Gastroenteritis, dehydration, pregnancy  FINAL ASSESSMENT AND PLAN  Gastroenteritis, ventral hernia   Plan: The patient had presented for abdominal pain with vomiting and diarrhea. Patient's labs are unremarkable. Patient's imaging did reveal a ventral hernia but no obstructive bowel.  Patient is in no distress, he states she is having this right-sided abdominal pain since her C-section in June.  She will be referred to general surgery for hernia repair.   Ulice Dash, MD   Note: This note was generated in part or whole with voice recognition software. Voice recognition is usually quite accurate but there are transcription errors that can and very often do occur. I apologize for any typographical errors that were not detected and corrected.     Emily Filbert, MD 11/04/17 1038

## 2017-11-07 ENCOUNTER — Telehealth: Payer: Self-pay | Admitting: Surgery

## 2017-11-07 NOTE — Telephone Encounter (Signed)
-----   Message from Ancil Linsey, MD sent at 11/06/2017 11:46 PM EDT ----- Regarding: Can you please schedule this patient for appointment with me? Can you please schedule this patient for appointment with me?  Thank you.          Barbara Cower

## 2017-11-07 NOTE — Telephone Encounter (Signed)
Left a message for the patient to call the office to schedule an appointment with Dr. Davis. °

## 2017-11-25 ENCOUNTER — Encounter: Payer: Self-pay | Admitting: Surgery

## 2017-11-25 NOTE — Telephone Encounter (Signed)
Letter mailed to patient to contact office to make an appointment with Dr. Earlene Plater

## 2017-12-29 ENCOUNTER — Encounter: Payer: Self-pay | Admitting: *Deleted

## 2017-12-29 ENCOUNTER — Other Ambulatory Visit: Payer: Self-pay

## 2017-12-29 ENCOUNTER — Ambulatory Visit (INDEPENDENT_AMBULATORY_CARE_PROVIDER_SITE_OTHER): Payer: Medicaid Other | Admitting: Surgery

## 2017-12-29 ENCOUNTER — Encounter: Payer: Self-pay | Admitting: Surgery

## 2017-12-29 VITALS — BP 164/110 | HR 80 | Temp 95.6°F | Ht 65.0 in | Wt 255.6 lb

## 2017-12-29 DIAGNOSIS — K432 Incisional hernia without obstruction or gangrene: Secondary | ICD-10-CM

## 2017-12-29 DIAGNOSIS — K439 Ventral hernia without obstruction or gangrene: Secondary | ICD-10-CM

## 2017-12-29 NOTE — Progress Notes (Signed)
Patient has been provided with information for the weight loss medical program-Dr. Debbra RidingAlexandria Kadolph. She will contact them directly.   The patient has been placed in recalls for 3 months with Dr. Earlene Plateravis to discuss hernia repair.

## 2017-12-29 NOTE — Progress Notes (Signed)
Surgical Clinic History and Physical  Referring provider:  No referring provider defined for this encounter.  HISTORY OF PRESENT ILLNESS (HPI):  37 y.o. female presents for evaluation of her lower abdominal incisional hernia. Patient reports first noticed her Right lower abdominal bulge shortly following c-section this past 06/2017, prior to which she says the occasionally painful bulge was not there. She has gained "a few pounds" since prior to her pregnancy, but not much additional weight and says she "has always been big". She describes multiple unsuccessful attempts to loose weight and does not wish to consider surgical weight loss options at this time. She says her hernia does not always cause her pain (though she recently presented to [facility] ED for lower abdominal pain, which prompted recent CT), but it causes her to feel bloated and discomfort, for which she requests to have her hernia repaired. She also says she rarely experienced constipation prior to her recent c-section, but says she's experienced much more constipation since her c-section this past June.  PAST MEDICAL HISTORY (PMH):  Past Medical History:  Diagnosis Date  . Anemia   . Anxiety   . Vaginal Pap smear, abnormal    PAST SURGICAL HISTORY (PSH):  Past Surgical History:  Procedure Laterality Date  . CESAREAN SECTION    . right shoulder surgery    . UTERINE FIBROID SURGERY      MEDICATIONS:  Prior to Admission medications   Not on File   ALLERGIES:  No Known Allergies   SOCIAL HISTORY:  Social History   Socioeconomic History  . Marital status: Married    Spouse name: Not on file  . Number of children: Not on file  . Years of education: Not on file  . Highest education level: Not on file  Occupational History  . Not on file  Social Needs  . Financial resource strain: Not on file  . Food insecurity:    Worry: Not on file    Inability: Not on file  . Transportation needs:    Medical: Not on file   Non-medical: Not on file  Tobacco Use  . Smoking status: Former Smoker    Types: Cigarettes  . Smokeless tobacco: Never Used  Substance and Sexual Activity  . Alcohol use: No  . Drug use: Never  . Sexual activity: Yes    Birth control/protection: None  Lifestyle  . Physical activity:    Days per week: Not on file    Minutes per session: Not on file  . Stress: Not on file  Relationships  . Social connections:    Talks on phone: Not on file    Gets together: Not on file    Attends religious service: Not on file    Active member of club or organization: Not on file    Attends meetings of clubs or organizations: Not on file    Relationship status: Not on file  . Intimate partner violence:    Fear of current or ex partner: Not on file    Emotionally abused: Not on file    Physically abused: Not on file    Forced sexual activity: Not on file  Other Topics Concern  . Not on file  Social History Narrative  . Not on file    The patient currently resides (home / rehab facility / nursing home): Home The patient normally is (ambulatory / bedbound): Ambulatory  FAMILY HISTORY:  Family History  Problem Relation Age of Onset  . Throat cancer Father   .  Anemia Sister   . Pulmonary disease Son     Otherwise negative/non-contributory.  REVIEW OF SYSTEMS:  Constitutional: denies any other weight loss, fever, chills, or sweats  Eyes: denies any other vision changes, history of eye injury  ENT: denies sore throat, hearing problems  Respiratory: denies shortness of breath, wheezing  Cardiovascular: denies chest pain, palpitations  Gastrointestinal: abdominal pain, N/V, and bowel function as per HPI Musculoskeletal: denies any other joint pains or cramps  Skin: Denies any other rashes or skin discolorations Neurological: denies any other headache, dizziness, weakness  Psychiatric: Denies any other depression, anxiety   All other review of systems were otherwise negative   VITAL  SIGNS:  BP (!) 164/110   Pulse 80   Temp (!) 95.6 F (35.3 C) (Temporal)   Ht 5\' 5"  (1.651 m)   Wt 255 lb 9.6 oz (115.9 kg)   SpO2 96%   BMI 42.53 kg/m    PHYSICAL EXAM:  Constitutional:  -- Obese body habitus  -- Awake, alert, and oriented x3  Eyes:  -- Pupils equally round and reactive to light  -- No scleral icterus  Ear, nose, throat:  -- No jugular venous distension -- No nasal drainage, bleeding Pulmonary:  -- No crackles  -- Equal breath sounds bilaterally -- Breathing non-labored at rest Cardiovascular:  -- S1, S2 present  -- No pericardial rubs  Gastrointestinal:  -- Abdomen soft and non-distended with mildly tender to palpation Right lower abdominal fullness, no guarding/rebound tenderness  -- No other abdominal masses appreciated, pulsatile or otherwise  Musculoskeletal and Integumentary:  -- Wounds or skin discoloration: None appreciated -- Extremities: B/L UE and LE FROM, hands and feet warm  Neurologic:  -- Motor function: Intact and symmetric -- Sensation: Intact and symmetric  Labs:  CBC Latest Ref Rng & Units 11/04/2017 04/07/2015 11/19/2013  WBC 4.0 - 10.5 K/uL 5.1 5.8 5.6  Hemoglobin 12.0 - 15.0 g/dL 16.113.8 09.613.0 11.0(L)  Hematocrit 36.0 - 46.0 % 42.3 40.2 35.4  Platelets 150 - 400 K/uL 216 253 250   CMP Latest Ref Rng & Units 11/04/2017 04/07/2015 11/19/2013  Glucose 70 - 99 mg/dL 045(W117(H) 098(J103(H) 191(Y109(H)  BUN 6 - 20 mg/dL 10 12 9   Creatinine 0.44 - 1.00 mg/dL 7.82(N1.04(H) 5.62(Z1.02(H) 3.081.00  Sodium 135 - 145 mmol/L 142 136 141  Potassium 3.5 - 5.1 mmol/L 4.7 4.0 4.3  Chloride 98 - 111 mmol/L 103 103 108(H)  CO2 22 - 32 mmol/L 29 26 27   Calcium 8.9 - 10.3 mg/dL 9.2 9.3 6.5(H8.3(L)  Total Protein 6.5 - 8.1 g/dL 7.6 7.1 7.3  Total Bilirubin 0.3 - 1.2 mg/dL 0.7 0.6 0.3  Alkaline Phos 38 - 126 U/L 66 46 50  AST 15 - 41 U/L 19 35 25  ALT 0 - 44 U/L 15 24 34   Imaging studies:  CT Abdomen and Pelvis with Contrast (11/04/2017) - personally reviewed with patient and  discussed Stomach, duodenum and small bowel are normal. There is no bowel obstruction; however a loop of small bowel enters a RIGHT ventral abdominal wall hernia. The hernia sac is just RIGHT of midline below the umbilicus. Sac measures 11 cm by 4.5 cm. The hernia mouth measures 3.3 cm (image 62/6). There is fluid in small bowel within the hernia sac. Approximately 12 cm of small bowel in the sac.  Assessment/Plan: (ICD-10's: 20K43.2) 37 y.o. female with increasingly symptomatic Right of midline lower abdominal incisional hernia 6 months s/p c-section this past June, complicated by pertinent comorbidities  including morbid obesity (BMI 43), uterine fibroids, and generalized anxiety disorder.               - discussed with patient signs and symptoms of hernia incarceration and obstruction             - strategies for manual self-reduction of patient's hernia also reviewed and discussed  - maintain hydration with high fiber heart healthy diet to reduce/minimize constipation +/- daily stool softener as needed             - all risks, benefits, and alternatives to laparoscopic repair of her ventral incisional hernia with mesh were discussed with the patient, all of her questions were answered to patient's expressed satisfaction, patient expresses she wishes to proceed, and informed consent was obtained.             - referral/contact information for bariatric medical specialist, Dr. Ashley Royalty, provided per patient's request             - return to clinic in 3 months unless hernia becomes increasingly symptomatic, prompting repair sooner with higher risk of recurrence prior to weight loss/risk factor modification  - until hernia repaired, consider velcro abdominal binder for relief, to be applied in morning prior to activities             - instructed to call if any questions or concerns  All of the above recommendations were discussed with the patient, and all of patient's questions were answered to  her expressed satisfaction.  Thank you for the opportunity to participate in this patient's care.  -- Scherrie Gerlach Earlene Plater, MD, RPVI [place]: [facility] General Surgery - Partnering for exceptional care. Office: [phone]

## 2017-12-29 NOTE — Patient Instructions (Addendum)
Patient is to return to the office in 3 months to discuss surgery for hernia repair. Patient will be given information for weight management, wear an abdominal binder which can be purchased at [employer].   Call the office with any questions or concerns.  High-Fiber Diet Fiber, also called dietary fiber, is a type of carbohydrate found in fruits, vegetables, whole grains, and beans. A high-fiber diet can have many health benefits. Your health care provider may recommend a high-fiber diet to help:  Prevent constipation. Fiber can make your bowel movements more regular.  Lower your cholesterol.  Relieve hemorrhoids, uncomplicated diverticulosis, or irritable bowel syndrome.  Prevent overeating as part of a weight-loss plan.  Prevent heart disease, type 2 diabetes, and certain cancers.  What is my plan? The recommended daily intake of fiber includes:  38 grams for men under age 75.  30 grams for men over age 58.  25 grams for women under age 59.  21 grams for women over age 69.  You can get the recommended daily intake of dietary fiber by eating a variety of fruits, vegetables, grains, and beans. Your health care provider may also recommend a fiber supplement if it is not possible to get enough fiber through your diet. What do I need to know about a high-fiber diet?  Fiber supplements have not been widely studied for their effectiveness, so it is better to get fiber through food sources.  Always check the fiber content on thenutrition facts label of any prepackaged food. Look for foods that contain at least 5 grams of fiber per serving.  Ask your dietitian if you have questions about specific foods that are related to your condition, especially if those foods are not listed in the following section.  Increase your daily fiber consumption gradually. Increasing your intake of dietary fiber too quickly may cause bloating, cramping, or gas.  Drink plenty of water. Water helps you to  digest fiber. What foods can I eat? Grains Whole-grain breads. Multigrain cereal. Oats and oatmeal. Brown rice. Barley. Bulgur wheat. Millet. Bran muffins. Popcorn. Rye wafer crackers. Vegetables Sweet potatoes. Spinach. Kale. Artichokes. Cabbage. Broccoli. Green peas. Carrots. Squash. Fruits Berries. Pears. Apples. Oranges. Avocados. Prunes and raisins. Dried figs. Meats and Other Protein Sources Navy, kidney, pinto, and soy beans. Split peas. Lentils. Nuts and seeds. Dairy Fiber-fortified yogurt. Beverages Fiber-fortified soy milk. Fiber-fortified orange juice. Other Fiber bars. The items listed above may not be a complete list of recommended foods or beverages. Contact your dietitian for more options. What foods are not recommended? Grains White bread. Pasta made with refined flour. White rice. Vegetables Fried potatoes. Canned vegetables. Well-cooked vegetables. Fruits Fruit juice. Cooked, strained fruit. Meats and Other Protein Sources Fatty cuts of meat. Fried Environmental education officer or fried fish. Dairy Milk. Yogurt. Cream cheese. Sour cream. Beverages Soft drinks. Other Cakes and pastries. Butter and oils. The items listed above may not be a complete list of foods and beverages to avoid. Contact your dietitian for more information. What are some tips for including high-fiber foods in my diet?  Eat a wide variety of high-fiber foods.  Make sure that half of all grains consumed each day are whole grains.  Replace breads and cereals made from refined flour or white flour with whole-grain breads and cereals.  Replace white rice with brown rice, bulgur wheat, or millet.  Start the day with a breakfast that is high in fiber, such as a cereal that contains at least 5 grams of fiber per serving.  Use beans in place of meat in soups, salads, or pasta.  Eat high-fiber snacks, such as berries, raw vegetables, nuts, or popcorn. This information is not intended to replace advice given to  you by your health care provider. Make sure you discuss any questions you have with your health care provider. Document Released: 01/11/2005 Document Revised: 06/19/2015 Document Reviewed: 06/26/2013 Elsevier Interactive Patient Education  [employer]2018 [employer] Inc[employer]

## 2018-03-30 ENCOUNTER — Encounter: Payer: Self-pay | Admitting: *Deleted

## 2018-03-30 ENCOUNTER — Other Ambulatory Visit: Payer: Self-pay

## 2018-03-30 ENCOUNTER — Encounter: Payer: Self-pay | Admitting: Surgery

## 2018-03-30 ENCOUNTER — Ambulatory Visit: Payer: Medicaid Other | Admitting: Surgery

## 2018-03-30 ENCOUNTER — Ambulatory Visit (INDEPENDENT_AMBULATORY_CARE_PROVIDER_SITE_OTHER): Payer: Medicaid Other | Admitting: Surgery

## 2018-03-30 VITALS — BP 164/111 | HR 80 | Temp 97.7°F | Ht 64.0 in | Wt 259.0 lb

## 2018-03-30 DIAGNOSIS — K432 Incisional hernia without obstruction or gangrene: Secondary | ICD-10-CM | POA: Diagnosis not present

## 2018-03-30 NOTE — Progress Notes (Signed)
Patient's surgery to be scheduled for 04-17-18 at [facility] with Dr. Earlene Plater.  The patient is aware she will be contacted by the Pre-Admission Testing Department to complete a phone interview sometime in the near future.  The patient is aware to call the office should she have further questions.

## 2018-03-30 NOTE — Patient Instructions (Signed)
Patient will be given information for weight management.Patient to be scheduled for incisional hernia repair.

## 2018-03-30 NOTE — Progress Notes (Signed)
Surgical Clinic Progress/Follow-up Note   HPI:  38 y.o. Female presents to clinic for follow-up evaluation of her increasingly symptomatic Right lower abdominal bulge, which she first noticed shortly following c-section this past 06/2017. Since her prior office appointment 3 months ago, at which she expressed uncertainty whether to proceed with repair of her hernia, patient reports she has gained weight, rather than lost weight, during which time her Right of infra-umbilical incisional has become more painful, particularly with coughing, sneezing, and laughing. She continues to pass +flatus and denies constipation, says her daughter is 64 months old and weighs ~15 lbs. She expresses she remains interested in consultation with bariatric medical clinic, but says she lost the contact information provided at her prior appointment. Patient now requests repair of her hernia.  Review of Systems:  Constitutional: denies any other weight loss, fever, chills, or sweats  Eyes: denies any other vision changes, history of eye injury  ENT: denies sore throat, hearing problems  Respiratory: denies shortness of breath, wheezing  Cardiovascular: denies chest pain, palpitations  Gastrointestinal: abdominal pain, N/V, and bowel function as per HPI Musculoskeletal: denies any other joint pains or cramps  Skin: Denies any other rashes or skin discolorations  Neurological: denies any other headache, dizziness, weakness  Psychiatric: denies any other depression, anxiety  All other review of systems: otherwise negative   Vital Signs:  BP (!) 164/111   Pulse 80   Temp 97.7 F (36.5 C) (Skin)   Ht 5\' 10"  (1.778 m)   Wt 259 lb (117.5 kg)   SpO2 97%   BMI 37.16 kg/m    Physical Exam:  Constitutional:  -- Obese body habitus  -- Awake, alert, and oriented x3  Eyes:  -- Pupils equally round and reactive to light  -- No scleral icterus  Ear, nose, throat:  -- No jugular venous distension  -- No nasal  drainage, bleeding Pulmonary:  -- No crackles -- Equal breath sounds bilaterally -- Breathing non-labored at rest Cardiovascular:  -- S1, S2 present  -- No pericardial rubs  Gastrointestinal:  -- Abdomen soft and obese, but non-distended, with moderately tender to palpation Right lower abdominal asymmetric fullness, no guarding/rebound tenderness -- No other abdominal masses appreciated, pulsatile or otherwise  Musculoskeletal / Integumentary:  -- Wounds or skin discoloration: None appreciated  -- Extremities: B/L UE and LE FROM, hands and feet warm Neurologic:  -- Motor function: intact and symmetric  -- Sensation: intact and symmetric   Laboratory studies:  CBC Latest Ref Rng & Units 11/04/2017 04/07/2015 11/19/2013  WBC 4.0 - 10.5 K/uL 5.1 5.8 5.6  Hemoglobin 12.0 - 15.0 g/dL 85.6 31.4 11.0(L)  Hematocrit 36.0 - 46.0 % 42.3 40.2 35.4  Platelets 150 - 400 K/uL 216 253 250   CMP Latest Ref Rng & Units 11/04/2017 04/07/2015 11/19/2013  Glucose 70 - 99 mg/dL 970(Y) 637(C) 588(F)  BUN 6 - 20 mg/dL 10 12 9   Creatinine 0.44 - 1.00 mg/dL 0.27(X) 4.12(I) 7.86  Sodium 135 - 145 mmol/L 142 136 141  Potassium 3.5 - 5.1 mmol/L 4.7 4.0 4.3  Chloride 98 - 111 mmol/L 103 103 108(H)  CO2 22 - 32 mmol/L 29 26 27   Calcium 8.9 - 10.3 mg/dL 9.2 9.3 7.6(H)  Total Protein 6.5 - 8.1 g/dL 7.6 7.1 7.3  Total Bilirubin 0.3 - 1.2 mg/dL 0.7 0.6 0.3  Alkaline Phos 38 - 126 U/L 66 46 50  AST 15 - 41 U/L 19 35 25  ALT 0 - 44 U/L 15  24 34   Imaging:  CT Abdomen and Pelvis with Contrast (11/04/2017) - personally reviewed with patient and discussed Stomach, duodenum and small bowel are normal. There is no bowel obstruction; however a loop of small bowel enters a RIGHT ventral abdominal wall hernia. The hernia sac is just RIGHT of midline below the umbilicus. Sac measures 11 cm by 4.5 cm. The hernia mouth measures 3.3 cm (image 62/6). There is fluid in small bowel within the hernia sac. Approximately 12  cm of small bowel in the sac.  Assessment:  38 y.o. yo Female with a problem list including...  Patient Active Problem List   Diagnosis Date Noted  . Abdominal pain 06/10/2017    Assessment/Plan: (ICD-10's: K43.2) 38 y.o. femalewith increasingly symptomatic Right of midline lower abdominal incisional hernia 6 months s/p c-section this past June, complicated by pertinent comorbidities including morbid obesity (BMI 43), uterine fibroids, and generalized anxiety disorder.  - discussed with patient signs and symptoms of hernia incarceration and obstruction - strategies for manual self-reduction of patient's hernia also reviewed and discussed             - maintain hydration with high fiber heart healthy diet to reduce/minimize constipation +/- daily stool softener as needed - all risks, benefits, alternatives to, and anticipated recovery followinglaparoscopic repair of her ventral incisional hernia with meshwere discussed with the patient, all of her questions were answered to patient's expressed satisfaction, patient expresses she wishes to proceed, and informed consent was obtained. - contact information for bariatric medical specialist, Dr. Ashley Royalty, provided per patient's request - will plan to proceed with laparoscopic repair of patient's ventral incisional hernia with mesh pending anesthesia and OR availability             - until hernia repaired, consider velcro abdominal binder for relief, to be applied in morning prior to activities - instructed to call if any questions or concerns  All of the above recommendations were discussed with the patient, and all of patient's questions were answered to her expressed satisfaction.  -- Scherrie Gerlach Earlene Plater, MD, RPVI [place]: [facility] General Surgery - Partnering for exceptional care. Office: [phone]

## 2018-03-31 ENCOUNTER — Encounter: Payer: Self-pay | Admitting: Surgery

## 2018-03-31 DIAGNOSIS — K432 Incisional hernia without obstruction or gangrene: Secondary | ICD-10-CM

## 2018-03-31 HISTORY — DX: Incisional hernia without obstruction or gangrene: K43.2

## 2018-03-31 NOTE — Addendum Note (Signed)
Addended by: Chrisandra Netters on: 03/31/2018 10:19 PM   Modules accepted: Kipp Brood

## 2018-04-09 ENCOUNTER — Encounter: Payer: Self-pay | Admitting: Emergency Medicine

## 2018-04-09 ENCOUNTER — Other Ambulatory Visit: Payer: Self-pay

## 2018-04-09 ENCOUNTER — Ambulatory Visit
Admission: EM | Admit: 2018-04-09 | Discharge: 2018-04-09 | Disposition: A | Discharge status: Home / Self Care | Payer: Medicaid Other | Attending: Emergency Medicine | Admitting: Emergency Medicine

## 2018-04-09 DIAGNOSIS — L0100 Impetigo, unspecified: Secondary | ICD-10-CM

## 2018-04-09 DIAGNOSIS — L853 Xerosis cutis: Secondary | ICD-10-CM | POA: Diagnosis not present

## 2018-04-09 DIAGNOSIS — R21 Rash and other nonspecific skin eruption: Secondary | ICD-10-CM

## 2018-04-09 MED ORDER — CEPHALEXIN 500 MG PO CAPS: 1000.0000 mg | ORAL_CAPSULE | Freq: Two times a day (BID) | ORAL | 0 refills | Status: AC

## 2018-04-09 MED ORDER — MUPIROCIN 2 % EX OINT: 1.0000 "application " | TOPICAL_OINTMENT | Freq: Three times a day (TID) | CUTANEOUS | 0 refills | Status: DC

## 2018-04-09 MED ORDER — HYDROCORTISONE 2.5 % EX OINT: TOPICAL_OINTMENT | Freq: Two times a day (BID) | CUTANEOUS | 0 refills | Status: DC

## 2018-04-09 NOTE — ED Triage Notes (Signed)
Pt c/o rash on her face. Rash burns, sore and painful. Started about 2 weeks ago. Rash is red, and blister like. Also her cervical lymph nodes are sore and tender.

## 2018-04-09 NOTE — Discharge Instructions (Addendum)
Here is a list of primary care providers who are taking new patients: ° °Dr. Deanna Jones, Dr. William Plonk °[address] °[address] °[place] [address] °[phone] ° °[facility] [place] °[address] [place] [address]  °[place] [address]  °[phone] ° °[facility] °[address]  °[place], [place] [address] °[phone] ° °[facility] [place] °[address]  °[phone] °[place], [place] [address] ° °Here are clinics/ other resources who will see you if you do not have insurance. Some have certain criteria that you must meet. Call them and find out what they are: ° °[facility]: °[address] [place] [address] [place], [place] [address] °Phone: [phone] °Hours: First and Third Saturdays of each Month, 9 a.m. - 1 p.m. ° °[facility]: °[address], [place], [place] [address] °Phone: [phone] °Hours: °Tuesday, 4 p.m. - 8 p.m. °Thursday, 1 p.m. - 8 p.m. °Wednesday, 9 a.m. - Noon ° °[facility] °[address], [place], [place] [address] °Phone: [phone] °Pharmacy Phone Number: [phone] °Dental Phone Number: [phone] °ACA Insurance Help: [phone] ° °Dental Hours: °Monday - Thursday, 8 a.m. - 6 p.m. ° °[facility] °[address] [place], [place] [address] °Phone: [phone] °Pharmacy Phone Number: [phone] °ACA Insurance Help: [phone] ° °[facility] °[address] [place], [place] [address] °Phone: [phone] °Pharmacy Phone Number: [phone] °ACA Insurance Help: [phone] ° °[facility] °[address] [place], [place] [address] °Phone: [phone] °ACA Insurance Help: [phone]  ° °[facility] °[address] ,  [address] °Phone: [phone] ° °Go to www.goodrx.com to look up your medications. This will give you a list of where you can find your prescriptions at the most affordable prices. Or ask the pharmacist what the cash price is,  or if they have any other discount programs available to help make your medication more affordable. This can be less expensive than what you would pay with insurance.   °

## 2018-04-09 NOTE — ED Provider Notes (Signed)
HPI  SUBJECTIVE:  Laura Benton is a 38 y.o. female who presents with 2 weeks of dry, irritated skin around her nares.  She reports yellowish crusting for about a week.  Has been putting Neosporin on it for the past 4 days.  She reports burning, itching pain and swelling by her left nare starting last night.  She reports an itchy, crusty rash on her chin starting 4 days ago.  Reports tender left side of the cervical lymphadenopathy starting today.  No blisters.  No contacts with similar rash.  She has been applying Neosporin and Vaseline without improvement in her symptoms.  No alleviating factors.  Symptoms are worse with the Neosporin today.  She denies any fevers.  She is using Ponds facial cream which is relatively new.  No new soaps, cosmetics.  Past medical history negative for diabetes, HSV 1, 2, MRSA.  LMP: Today.  Denies possibility being pregnant.  PMD: None.   Past Medical History:  Diagnosis Date  . Anemia   . Anxiety   . Vaginal Pap smear, abnormal     Past Surgical History:  Procedure Laterality Date  . CESAREAN SECTION    . right shoulder surgery    . UTERINE FIBROID SURGERY      Family History  Problem Relation Age of Onset  . Throat cancer Father   . Anemia Sister   . Pulmonary disease Son     Social History   Tobacco Use  . Smoking status: Former Smoker    Types: Cigarettes  . Smokeless tobacco: Never Used  Substance Use Topics  . Alcohol use: Yes  . Drug use: Never    No current facility-administered medications for this encounter.   Current Outpatient Medications:  .  cephALEXin (KEFLEX) 500 MG capsule, Take 2 capsules (1,000 mg total) by mouth 2 (two) times daily for 5 days., Disp: 20 capsule, Rfl: 0 .  hydrocortisone 2.5 % ointment, Apply topically 2 (two) times daily., Disp: 30 g, Rfl: 0 .  mupirocin ointment (BACTROBAN) 2 %, Apply 1 application topically 3 (three) times daily., Disp: 22 g, Rfl: 0  No Known Allergies   ROS  As noted  in HPI.   Physical Exam  BP (!) 155/92 (BP Location: Left Arm)   Pulse 84   Temp 98.5 F (36.9 C) (Oral)   Resp 18   Ht 5\' 5"  (1.651 m)   Wt 113.4 kg   LMP 04/09/2018   SpO2 99%   BMI 41.60 kg/m   Constitutional: Well developed, well nourished, no acute distress Eyes:  EOMI, conjunctiva normal bilaterally HENT: Normocephalic, atraumatic,mucus membranes moist Respiratory: Normal inspiratory effort Cardiovascular: Normal rate GI: nondistended skin: Tender erythematous raised area of induration next to the left nare with pustules.  No appreciable crusting.  Positive pustular tender circular rash on the chin without crusting.  Dry Skin with yellowish crusting right nare.  See pictures.           Neck: No appreciable cervical lymphadenopathy. Musculoskeletal: no deformities Neurologic: Alert & oriented x 3, no focal neuro deficits Psychiatric: Speech and behavior appropriate   ED Course   Medications - No data to display  No orders of the defined types were placed in this encounter.   No results found for this or any previous visit (from the past 24 hour(s)). No results found.  ED Clinical Impression  Impetigo   ED Assessment/Plan  Suspect impetigo.  Erysipelas in the differential but she has no fevers.  Will send  home with topical steroid cream, Bactroban, Keflex for 5 days.  Strict handwashing. Strict handwashing.  Will provide a primary care referral list for ongoing care.  Follow-up with dermatology if not better in 10 days.   Discussed  MDM, treatment plan, and plan for follow-up with patient.. patient agrees with plan.   Meds ordered this encounter  Medications  . mupirocin ointment (BACTROBAN) 2 %    Sig: Apply 1 application topically 3 (three) times daily.    Dispense:  22 g    Refill:  0  . hydrocortisone 2.5 % ointment    Sig: Apply topically 2 (two) times daily.    Dispense:  30 g    Refill:  0  . cephALEXin (KEFLEX) 500 MG capsule    Sig:  Take 2 capsules (1,000 mg total) by mouth 2 (two) times daily for 5 days.    Dispense:  20 capsule    Refill:  0    *This clinic note was created using Scientist, clinical (histocompatibility and immunogenetics). Therefore, there may be occasional mistakes despite careful proofreading.   ?   Domenick Gong, MD 04/09/18 1652

## 2018-04-11 ENCOUNTER — Encounter
Admission: RE | Admit: 2018-04-11 | Discharge: 2018-04-11 | Disposition: A | Discharge status: Home / Self Care | Payer: Medicaid Other | Source: Ambulatory Visit | Attending: Surgery | Admitting: Surgery

## 2018-04-11 ENCOUNTER — Other Ambulatory Visit: Payer: Self-pay

## 2018-04-11 HISTORY — DX: Elevated blood-pressure reading, without diagnosis of hypertension: R03.0

## 2018-04-11 HISTORY — DX: Gastro-esophageal reflux disease without esophagitis: K21.9

## 2018-04-11 HISTORY — DX: Cardiac murmur, unspecified: R01.1

## 2018-04-11 HISTORY — DX: Other complications of anesthesia, initial encounter: T88.59XA

## 2018-04-11 HISTORY — DX: Adverse effect of unspecified anesthetic, initial encounter: T41.45XA

## 2018-04-11 NOTE — Patient Instructions (Signed)
Your procedure is scheduled on: 04-17-18 MONDAY Report to Same Day Surgery 2nd floor medical mall [facility] Entrance-take elevator on left to 2nd floor.  Check in with surgery information desk.) To find out your arrival time please call [phone] between 1PM - 3PM on 04-14-18 FRIDAY  Remember: Instructions that are not followed completely may result in serious medical risk, up to and including death, or upon the discretion of your surgeon and anesthesiologist your surgery may need to be rescheduled.    _x___ 1. Do not eat food after midnight the night before your procedure. NO GUM OR CANDY AFTER MIDNIGHT.  You may drink clear liquids up to 2 hours before you are scheduled to arrive at the hospital for your procedure.  Do not drink clear liquids within 2 hours of your scheduled arrival to the hospital.  Clear liquids include  --Water or Apple juice without pulp  --Clear carbohydrate beverage such as ClearFast or Gatorade  --Black Coffee or Clear Tea (No milk, no creamers, do not add anything to the coffee or Tea   ____Ensure clear carbohydrate drink on the way to the hospital for bariatric patients  ____Ensure clear carbohydrate drink 3 hours before surgery for Dr Rutherford Nail patients if physician instructed. .     __x__ 2. No Alcohol for 24 hours before or after surgery.   __x__3. No Smoking or e-cigarettes for 24 prior to surgery.  Do not use any chewable tobacco products for at least 6 hour prior to surgery   ____  4. Bring all medications with you on the day of surgery if instructed.    __x__ 5. Notify your doctor if there is any change in your medical condition     (cold, fever, infections).    x___6. On the morning of surgery brush your teeth with toothpaste and water.  You may rinse your mouth with mouth wash if you wish.  Do not swallow any toothpaste or mouthwash.   Do not wear jewelry, make-up, hairpins, clips or nail polish.  Do not wear lotions, powders, or perfumes.  You may wear deodorant.  Do not shave 48 hours prior to surgery. Men may shave face and neck.  Do not bring valuables to the hospital.    [facility] is not responsible for any belongings or valuables.               Contacts, dentures or bridgework may not be worn into surgery.  Leave your suitcase in the car. After surgery it may be brought to your room.  For patients admitted to the hospital, discharge time is determined by your treatment team.  _  Patients discharged the day of surgery will not be allowed to drive home.  You will need someone to drive you home and stay with you the night of your procedure.    Please read over the following fact sheets that you were given:   [facility] Preparing for Surgery  ____ Take anti-hypertensive listed below, cardiac, seizure, asthma, anti-reflux and psychiatric medicines. These include:  1. NONE  2.  3.  4.  5.  6.  ____Fleets enema or Magnesium Citrate as directed.   _x___ Use CHG Soap or sage wipes as directed on instruction sheet   ____ Use inhalers on the day of surgery and bring to hospital day of surgery  ____ Stop Metformin and Janumet 2 days prior to surgery.    ____ Take 1/2 of usual insulin dose the night before surgery and none  on the morning surgery.   ____ Follow recommendations from Cardiologist, Pulmonologist or PCP regarding  stopping Aspirin, Coumadin, Plavix ,Eliquis, Effient, or Pradaxa, and Pletal.  X____Stop Anti-inflammatories such as Advil, Aleve, Ibuprofen, Motrin, Naproxen, Naprosyn, Goodies powders or aspirin products NOW-OK to take Tylenol   ____ Stop supplements until after surgery.     ____ Bring C-Pap to the hospital.

## 2018-04-11 NOTE — Addendum Note (Signed)
Addended by: Chrisandra Netters on: 04/11/2018 01:41 PM   Modules accepted: Orders, SmartSet

## 2018-04-13 ENCOUNTER — Telehealth: Payer: Self-pay | Admitting: *Deleted

## 2018-04-13 NOTE — Telephone Encounter (Signed)
Patient called the office back and was notified per previous message. She verbalizes understanding.  

## 2018-04-13 NOTE — Telephone Encounter (Signed)
Tried to call patient but no answer and not able to leave a message.  Due to recent COVID-19 restrictions, elective surgeries will need to be postponed. Surgery scheduled for 04-17-18 with Dr. Earlene Plater will be cancelled.   The patient will be contacted once restrictions have been lifted regarding rescheduling.   Patient should not need to phone interview again if this was previously completed.

## 2018-05-18 ENCOUNTER — Ambulatory Visit: Admission: RE | Admit: 2018-05-18 | Payer: Medicaid Other | Source: Home / Self Care | Admitting: Surgery

## 2018-05-18 ENCOUNTER — Encounter: Admission: RE | Payer: Self-pay | Source: Home / Self Care

## 2018-05-18 SURGERY — REPAIR, HERNIA, VENTRAL, LAPAROSCOPIC
Anesthesia: General

## 2018-06-20 ENCOUNTER — Telehealth: Payer: Self-pay | Admitting: *Deleted

## 2018-06-20 NOTE — Telephone Encounter (Signed)
Patient contacted today and we discussed her having surgery on 07-12-18 at [facility] with Dr. Earlene Plater.   The patient is aware she would need to have COVID testing done on 07-07-18 at the [facility] [address] thru.   Patient will be contacted once we have gotten surgery posted with Jeani Hawking for further instructions.   The patient will require a pre-op visit.

## 2018-06-20 NOTE — Telephone Encounter (Signed)
Tried to reach patient but no answer and not able to leave a message.   Patient's surgery has been scheduled for 07-12-18 at [facility] with Dr. Earlene Plater.   The patient is aware to have COVID-19 testing done prior. She needs to be notified to go on 07-07-18 at the [facility] drive thru ([address] [facility]) between 10:30 am and 12:30 pm. She also needs to be aware to isolate after, have no visitors, wash hands frequently, and avoid touching face.   The patient will need to Pre-Admit at [facility] on 07-06-18 at 1:15 pm. Patient will check in at the short stay entrance.   The patient will be contacted by the [facility] staff for further instructions including arrival time.   Patient needs to be aware to be NPO after midnight and have a driver.   Patient  may have no visitors and driver will need to wait in the car due to COVID-19 restrictions.

## 2018-06-20 NOTE — Telephone Encounter (Signed)
-----   Message from Ancil Linsey, MD sent at 06/16/2018 11:30 AM EDT ----- Regarding: Laparoscopic ventral hernia repair with mesh for Laura Benton Hi,  I just got off the phone with [facility], and she expressed interest in having her laparoscopic repair of ventral hernia with mesh performed by me at [facility]. Because her last in-person appointment was in March, I will need to see her for an appointment prior to surgery, but her surgery can definitely be scheduled prior to her pre-op appointment.  Please let me know if you have any questions and thank you.  Have a good weekend,        - [employer]

## 2018-06-21 NOTE — Telephone Encounter (Signed)
I was able to reach the patient by phone today.   The patient was notified per previous message.   Patient will come in for a pre-op visit on 07-06-18 at 11:15 am with Dr. Earlene Plater.   The patient was instructed to call the office should she have further questions.

## 2018-07-03 NOTE — Patient Instructions (Signed)
Laura KnucklesChristian Holman-Brown  07/03/2018     @PREFPERIOPPHARMACY @   Your procedure is scheduled on  07/12/2018.  [facility]  [facility]  [facility].M.  Call this number if you have problems the morning of surgery:  [phone]   Remember:  Do not eat or drink after midnight.                        Take these medicines the morning of surgery with A SIP OF WATER  None    Do not wear jewelry, make-up or nail polish.  Do not wear lotions, powders, or perfumes, or deodorant.  Do not shave 48 hours prior to surgery.  Men may shave face and neck.  Do not bring valuables to the hospital.  [facility] not [facility] any belongings or valuables.  Contacts, dentures or bridgework may not be worn into surgery.  Leave your suitcase in the car.  After surgery it may be brought to your room.  For patients admitted to the hospital, discharge time will be determined by your treatment team.  Patients discharged the day of surgery will not be allowed to drive home.   Name and phone number of your driver:   family Special instructions:  None  Please read over the following fact sheets that you were given. Anesthesia Post-op Instructions and Care and Recovery After Surgery       Laparoscopic Inguinal Hernia Repair, Adult, Care After This sheet gives you information about how to care for yourself after your procedure. Your health care provider may also give you more specific instructions. If you have problems or questions, contact your health care provider. What can I expect after the procedure? After the procedure, it is common to have:  Pain.  Swelling and bruising around the incision area.  Scrotal swelling, in men.  Some fluid or blood draining from your incisions. Follow these instructions at home: Incision care  Follow instructions from your health care provider about how to take care of your incisions. Make sure you: ? Wash your hands with soap and water before you  change your bandage (dressing). If soap and water are not available, use hand sanitizer. ? Change your dressing as told by your health care provider. ? Leave stitches (sutures), skin glue, or adhesive strips in place. These skin closures may need to stay in place for 2 weeks or longer. If adhesive strip edges start to loosen and curl up, you may trim the loose edges. Do not remove adhesive strips completely unless your health care provider tells you to do that.  Check your incision area every day for signs of infection. Check for: ? More redness, swelling, or pain. ? More fluid or blood. ? Warmth. ? Pus or a bad smell.  Wear loose, soft clothing while your incisions heal. Driving  Do not drive or use heavy machinery while taking prescription pain medicine.  Do not drive for 24 hours if you were given a medicine to help you relax (sedative) during your procedure. Activity  Do not lift anything that is heavier than 10 lb (4.5 kg), or the limit that you are told, until your health care provider says that it is safe.  Ask your health care provider what activities are safe for you.A lot of activity during the first week after surgery can increase pain and swelling. For 1 week after your procedure: ? Avoid activities that take a lot  of effort, such as exercise or sports. ? You may walk and climb stairs as needed for daily activity, but avoid long walks or climbing stairs for exercise. Managing pain and swelling   Put ice on painful or swollen areas: ? Put ice in a plastic bag. ? Place a towel between your skin and the bag. ? Leave the ice on for 20 minutes, 2-3 times a day. General instructions  Do not take baths, swim, or use a hot tub until your health care provider approves. Ask your health care provider if you may take showers. You may only be allowed to take sponge baths.  Take over-the-counter and prescription medicines only as told by your health care provider.  To prevent or  treat constipation while you are taking prescription pain medicine, your health care provider may recommend that you: ? Drink enough fluid to keep your urine pale yellow. ? Take over-the-counter or prescription medicines. ? Eat foods that are high in fiber, such as fresh fruits and vegetables, whole grains, and beans. ? Limit foods that are high in fat and processed sugars, such as fried and sweet foods.  Do not use any products that contain nicotine or tobacco, such as cigarettes and e-cigarettes. If you need help quitting, ask your health care provider.  Drink enough fluid to keep your urine pale yellow.  Keep all follow-up visits as told by your health care provider. This is important. Contact a health care provider if:  You have more redness, swelling, or pain around your incisions or your groin area.  You have more swelling in your scrotum.  You have more fluid or blood coming from your incisions.  Your incisions feel warm to the touch.  You have severe pain and medicines do not help.  You have abdominal pain or swelling.  You cannot eat or drink without vomiting.  You cannot urinate or pass a bowel movement.  You faint.  You feel dizzy.  You have nausea and vomiting.  You have a fever. Get help right away if:  You have pus or a bad smell coming from your incisions.  You have chest pain.  You have problems breathing. Summary  Pain, swelling, and bruising are common after the procedure.  Check your incision area every day for signs of infection, such as more redness, swelling, or pain.  Put ice on painful or swollen areas for 20 minutes, 2-3 times a day. This information is not intended to replace advice given to you by your health care provider. Make sure you discuss any questions you have with your health care provider. Document Released: 04/22/2016 Document Revised: 04/22/2016 Document Reviewed: 04/22/2016 Elsevier Interactive Patient Education  2019  [place]. How to Use Chlorhexidine Before Surgery Chlorhexidine gluconate (CHG) is a germ-killing (antiseptic) solution that is used to clean the skin. It gets rid of the bacteria that normally live on the skin. Cleaning your skin with CHG before surgery helps lower the risk for infection after surgery. To clean your skin before surgery, you may be given:  A CHG solution to use in the shower.  A prepackaged cloth that contains CHG. What are the risks? Risks of using CHG include:  A skin reaction.  Hearing loss, if CHG gets in your ears.  Eye injury, if CHG gets in your eyes and is not rinsed out.  The CHG product catching fire. Make sure that you avoid smoking and flames after applying CHG to your skin. Do not use CHG:  If  you have a chlorhexidine allergy or have previously reacted to chlorhexidine.  On babies younger than 532 months of age. How to use CHG solution   Use CHG only as told by your health care provider, and follow the instructions on the label.  Use CHG solution while taking a shower. Follow these steps when using CHG solution (unless your health care provider gives you different instructions): 1. Start the shower. 2. Use your normal soap and shampoo to wash your face and hair. 3. Turn off the shower or move out of the shower stream. 4. Pour the CHG onto a clean washcloth. Do not use any type of brush or rough-edged sponge. 5. Starting at your neck, lather your body down to your toes. Make sure you:  Pay special attention to the part of your body where you will be having surgery. Scrub this area for at least 1 minute.  Use the full amount of CHG as directed. Usually, this is one bottle.  Do not use CHG on your head or face. If the solution gets into your ears or eyes, rinse them well with water.  Avoid your genital area.  Avoid any areas of skin that have broken skin, cuts, or scrapes.  Scrub your back and under your arms. Make sure to wash skin folds. 6.  Let the lather sit on your skin for 1-2 minutes or as long as told by your health care provider. 7. Thoroughly rinse your entire body in the shower. Make sure that all body creases and crevices are rinsed well. 8. Dry off with a clean towel. Do not put any substances on your body afterward, such as powder, lotion, or perfume. 9. Put on clean clothes or pajamas. 10. If it is the night before your surgery, sleep in clean sheets. How to use CHG prepackaged cloths   Only use CHG cloths as told by your health care provider, and follow the instructions on the label.  Use the CHG cloth on clean, dry skin. Follow these steps when using a CHG cloth (unless your health care provider gives you different instructions): 1. Using the CHG cloth, vigorously scrub the part of your body where you will be having surgery. Scrub using a back-and-forth motion for 3 minutes. The area on your body should be completely wet with CHG when you are done scrubbing. 2. Do not rinse. Discard the cloth and let the area air-dry for 1 minute. Do not put any substances on your body afterward, such as powder, lotion, or perfume. 3. Put on clean clothes or pajamas. 4. If it is the night before your surgery, sleep in clean sheets. Contact a health care provider if:  Your skin gets irritated after scrubbing.  You have questions about using your solution or cloth. Get help right away if:  Your eyes become very red or swollen.  Your eyes itch badly.  Your skin itches badly and is red or swollen.  Your hearing changes.  You have trouble seeing.  You have swelling or tingling in your mouth or throat.  You have trouble breathing.  You swallow any chlorhexidine. Summary  Chlorhexidine gluconate (CHG) is a germ-killing (antiseptic) solution that is used to clean the skin. Cleaning your skin with CHG before surgery helps lower the risk for infection after surgery.  You may be given CHG to use at home. It may be in a bottle  or in a prepackaged cloth to use on your skin. Carefully follow your health care provider's instructions and  the instructions on the product label.  Do not use CHG if you have a chlorhexidine allergy.  Contact your health care provider if your skin gets irritated after scrubbing. This information is not intended to replace advice given to you by your health care provider. Make sure you discuss any questions you have with your health care provider. Document Released: 10/06/2011 Document Revised: 12/09/2016 Document Reviewed: 12/09/2016 Elsevier Interactive Patient Education  2019 [employer]

## 2018-07-06 ENCOUNTER — Ambulatory Visit (INDEPENDENT_AMBULATORY_CARE_PROVIDER_SITE_OTHER): Payer: Medicaid Other | Admitting: Surgery

## 2018-07-06 ENCOUNTER — Encounter (HOSPITAL_COMMUNITY): Payer: Self-pay

## 2018-07-06 ENCOUNTER — Encounter: Payer: Self-pay | Admitting: Surgery

## 2018-07-06 ENCOUNTER — Other Ambulatory Visit: Payer: Self-pay

## 2018-07-06 ENCOUNTER — Encounter (HOSPITAL_COMMUNITY)
Admission: RE | Admit: 2018-07-06 | Discharge: 2018-07-06 | Disposition: A | Discharge status: Home / Self Care | Payer: Medicaid Other | Source: Ambulatory Visit | Attending: Surgery | Admitting: Surgery

## 2018-07-06 VITALS — BP 153/93 | HR 92 | Temp 97.2°F | Resp 18 | Ht 64.0 in | Wt 263.0 lb

## 2018-07-06 DIAGNOSIS — K432 Incisional hernia without obstruction or gangrene: Secondary | ICD-10-CM

## 2018-07-06 DIAGNOSIS — Z01812 Encounter for preprocedural laboratory examination: Secondary | ICD-10-CM | POA: Insufficient documentation

## 2018-07-06 LAB — CBC WITH DIFFERENTIAL/PLATELET
Abs Immature Granulocytes: 0.03 10*3/uL (ref 0.00–0.07)
Basophils Absolute: 0 10*3/uL (ref 0.0–0.1)
Basophils Relative: 1 %
Eosinophils Absolute: 0.2 10*3/uL (ref 0.0–0.5)
Eosinophils Relative: 2 %
HCT: 41.1 % (ref 36.0–46.0)
Hemoglobin: 12.8 g/dL (ref 12.0–15.0)
Immature Granulocytes: 1 %
Lymphocytes Relative: 36 %
Lymphs Abs: 2.4 10*3/uL (ref 0.7–4.0)
MCH: 28.3 pg (ref 26.0–34.0)
MCHC: 31.1 g/dL (ref 30.0–36.0)
MCV: 90.7 fL (ref 80.0–100.0)
Monocytes Absolute: 0.6 10*3/uL (ref 0.1–1.0)
Monocytes Relative: 9 %
Neutro Abs: 3.4 10*3/uL (ref 1.7–7.7)
Neutrophils Relative %: 51 %
Platelets: 227 10*3/uL (ref 150–400)
RBC: 4.53 MIL/uL (ref 3.87–5.11)
RDW: 14.2 % (ref 11.5–15.5)
WBC: 6.6 10*3/uL (ref 4.0–10.5)
nRBC: 0 % (ref 0.0–0.2)

## 2018-07-06 LAB — COMPREHENSIVE METABOLIC PANEL
ALT: 33 U/L (ref 0–44)
AST: 40 U/L (ref 15–41)
Albumin: 3.9 g/dL (ref 3.5–5.0)
Alkaline Phosphatase: 71 U/L (ref 38–126)
Anion gap: 10 (ref 5–15)
BUN: 11 mg/dL (ref 6–20)
CO2: 21 mmol/L — ABNORMAL LOW (ref 22–32)
Calcium: 8.6 mg/dL — ABNORMAL LOW (ref 8.9–10.3)
Chloride: 104 mmol/L (ref 98–111)
Creatinine, Ser: 0.89 mg/dL (ref 0.44–1.00)
GFR calc Af Amer: >60 mL/min (ref >60)
GFR calc non Af Amer: >60 mL/min (ref >60)
Glucose, Bld: 99 mg/dL (ref 70–99)
Potassium: 4.1 mmol/L (ref 3.5–5.1)
Sodium: 135 mmol/L (ref 135–145)
Total Bilirubin: 0.3 mg/dL (ref 0.3–1.2)
Total Protein: 7.2 g/dL (ref 6.5–8.1)

## 2018-07-06 NOTE — Patient Instructions (Signed)
Hernia, Adult ° °  ° °A hernia happens when tissue inside your body pushes out through a weak spot in your belly muscles (abdominal wall). This makes a round lump (bulge). The lump may be: °· In a scar from surgery that was done in your belly (incisional hernia). °· Near your belly button (umbilical hernia). °· In your groin (inguinal hernia). Your groin is the area where your leg meets your lower belly (abdomen). This kind of hernia could also be: °? In your scrotum, if you are female. °? In folds of skin around your vagina, if you are female. °· In your upper thigh (femoral hernia). °· Inside your belly (hiatal hernia). This happens when your stomach slides above the muscle between your belly and your chest (diaphragm). °If your hernia is small and it does not cause pain, you may not need treatment. If your hernia is large or it causes pain, you may need surgery. °Follow these instructions at home: °Activity °· Avoid stretching or overusing (straining) the muscles near your hernia. Straining can happen when you: °? Lift something heavy. °? Poop (have a bowel movement). °· Do not lift anything that is heavier than 10 lb (4.5 kg), or the limit that you are told, until your doctor says that it is safe. °· Use the strength of your legs when you lift something heavy. Do not use only your back muscles to lift. °General instructions °· Do these things if told by your doctor so you do not have trouble pooping (constipation): °? Drink enough fluid to keep your pee (urine) pale yellow. °? Eat foods that are high in fiber. These include fresh fruits and vegetables, whole grains, and beans. °? Limit foods that are high in fat and processed sugars. These include foods that are fried or sweet. °? Take medicine for trouble pooping. °· When you cough, try to cough gently. °· You may try to push your hernia in by very gently pressing on it when you are lying down. Do not try to force the bulge back in if it will not push in  easily. °· If you are overweight, work with your doctor to lose weight safely. °· Do not use any products that have nicotine or tobacco in them. These include cigarettes and e-cigarettes. If you need help quitting, ask your doctor. °· If you will be having surgery (hernia repair), watch your hernia for changes in shape, size, or color. Tell your doctor if you see any changes. °· Take over-the-counter and prescription medicines only as told by your doctor. °· Keep all follow-up visits as told by your doctor. °Contact a doctor if: °· You get new pain, swelling, or redness near your hernia. °· You poop fewer times in a week than normal. °· You have trouble pooping. °· You have poop (stool) that is more dry than normal. °· You have poop that is harder or larger than normal. °Get help right away if: °· You have a fever. °· You have belly pain that gets worse. °· You feel sick to your stomach (nauseous). °· You throw up (vomit). °· Your hernia cannot be pushed in by very gently pressing on it when you are lying down. Do not try to force the bulge back in if it will not push in easily. °· Your hernia: °? Changes in shape or size. °? Changes color. °? Feels hard or it hurts when you touch it. °These symptoms may represent a serious problem that is an emergency. Do not   wait to see if the symptoms will go away. Get medical help right away. Call your local emergency services (911 in the [place] °Summary °· A hernia happens when tissue inside your body pushes out through a weak spot in the belly muscles. This creates a bulge. °· If your hernia is small and it does not hurt, you may not need treatment. If your hernia is large or it hurts, you may need surgery. °· If you will be having surgery, watch your hernia for changes in shape, size, or color. Tell your doctor about any changes. °This information is not intended to replace advice given to you by your health care provider. Make sure you discuss any questions you have with  your health care provider. °Document Released: 07/01/2009 Document Revised: 10/13/2016 Document Reviewed: 10/13/2016 °Elsevier Interactive Patient Education © 2019 Elsevier Inc. ° °
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
## 2018-07-07 ENCOUNTER — Other Ambulatory Visit
Admission: RE | Admit: 2018-07-07 | Discharge: 2018-07-07 | Disposition: A | Discharge status: Home / Self Care | Payer: Medicaid Other | Source: Ambulatory Visit | Attending: Surgery | Admitting: Surgery

## 2018-07-07 DIAGNOSIS — Z1159 Encounter for screening for other viral diseases: Secondary | ICD-10-CM | POA: Diagnosis not present

## 2018-07-07 DIAGNOSIS — Z01812 Encounter for preprocedural laboratory examination: Secondary | ICD-10-CM | POA: Insufficient documentation

## 2018-07-08 LAB — NOVEL CORONAVIRUS, NAA (HOSP ORDER, SEND-OUT TO REF LAB; TAT 18-24 HRS): SARS-CoV-2, NAA: NOT DETECTED

## 2018-07-12 ENCOUNTER — Ambulatory Visit (HOSPITAL_COMMUNITY): Payer: Medicaid Other | Admitting: Anesthesiology

## 2018-07-12 ENCOUNTER — Other Ambulatory Visit: Payer: Self-pay

## 2018-07-12 ENCOUNTER — Encounter (HOSPITAL_COMMUNITY): Payer: Self-pay | Admitting: Anesthesiology

## 2018-07-12 ENCOUNTER — Ambulatory Visit (HOSPITAL_COMMUNITY)
Admission: RE | Admit: 2018-07-12 | Discharge: 2018-07-12 | Disposition: A | Discharge status: Home / Self Care | Payer: Medicaid Other | Attending: Surgery | Admitting: Surgery

## 2018-07-12 ENCOUNTER — Encounter (HOSPITAL_COMMUNITY)
Admission: RE | Disposition: A | Discharge status: Home / Self Care | Payer: Self-pay | Source: Home / Self Care | Attending: Surgery

## 2018-07-12 DIAGNOSIS — Z9119 Patient's noncompliance with other medical treatment and regimen: Secondary | ICD-10-CM | POA: Insufficient documentation

## 2018-07-12 DIAGNOSIS — K432 Incisional hernia without obstruction or gangrene: Secondary | ICD-10-CM | POA: Diagnosis not present

## 2018-07-12 DIAGNOSIS — Z87891 Personal history of nicotine dependence: Secondary | ICD-10-CM | POA: Insufficient documentation

## 2018-07-12 DIAGNOSIS — Z6841 Body Mass Index (BMI) 40.0 and over, adult: Secondary | ICD-10-CM | POA: Insufficient documentation

## 2018-07-12 DIAGNOSIS — K430 Incisional hernia with obstruction, without gangrene: Secondary | ICD-10-CM | POA: Diagnosis not present

## 2018-07-12 HISTORY — PX: INCISIONAL HERNIA REPAIR: SHX193

## 2018-07-12 LAB — PREGNANCY, URINE: Preg Test, Ur: NEGATIVE

## 2018-07-12 SURGERY — REPAIR, HERNIA, INCISIONAL, LAPAROSCOPIC
Anesthesia: General | Site: Abdomen

## 2018-07-12 MED ORDER — CEFAZOLIN SODIUM-DEXTROSE 1-4 GM/50ML-% IV SOLN
INTRAVENOUS | Status: AC
  Filled 2018-07-12: qty 50

## 2018-07-12 MED ORDER — GABAPENTIN 300 MG PO CAPS
300.0000 mg | ORAL_CAPSULE | ORAL | Status: AC
  Administered 2018-07-12: 300 mg via ORAL
  Filled 2018-07-12: qty 1

## 2018-07-12 MED ORDER — SUCCINYLCHOLINE CHLORIDE 200 MG/10ML IV SOSY
PREFILLED_SYRINGE | INTRAVENOUS | Status: AC
  Filled 2018-07-12: qty 10

## 2018-07-12 MED ORDER — OXYCODONE-ACETAMINOPHEN 5-325 MG PO TABS: 1.0000 | ORAL_TABLET | ORAL | 0 refills | Status: DC | PRN

## 2018-07-12 MED ORDER — MIDAZOLAM HCL 5 MG/5ML IJ SOLN
INTRAMUSCULAR | Status: DC | PRN
  Administered 2018-07-12: 2 mg via INTRAVENOUS

## 2018-07-12 MED ORDER — ONDANSETRON HCL 4 MG/2ML IJ SOLN
INTRAMUSCULAR | Status: DC | PRN
  Administered 2018-07-12: 4 mg via INTRAVENOUS

## 2018-07-12 MED ORDER — LIDOCAINE 2% (20 MG/ML) 5 ML SYRINGE
INTRAMUSCULAR | Status: AC
  Filled 2018-07-12: qty 5

## 2018-07-12 MED ORDER — DEXAMETHASONE SODIUM PHOSPHATE 10 MG/ML IJ SOLN
INTRAMUSCULAR | Status: AC
  Filled 2018-07-12: qty 1

## 2018-07-12 MED ORDER — MIDAZOLAM HCL 2 MG/2ML IJ SOLN: 0.5000 mg | Freq: Once | INTRAMUSCULAR | Status: DC | PRN

## 2018-07-12 MED ORDER — CEFAZOLIN SODIUM-DEXTROSE 2-4 GM/100ML-% IV SOLN
INTRAVENOUS | Status: AC
  Filled 2018-07-12: qty 100

## 2018-07-12 MED ORDER — ROCURONIUM BROMIDE 50 MG/5ML IV SOSY
PREFILLED_SYRINGE | INTRAVENOUS | Status: DC | PRN
  Administered 2018-07-12: 20 mg via INTRAVENOUS
  Administered 2018-07-12: 30 mg via INTRAVENOUS
  Administered 2018-07-12: 20 mg via INTRAVENOUS

## 2018-07-12 MED ORDER — PROPOFOL 10 MG/ML IV BOLUS
INTRAVENOUS | Status: AC
  Filled 2018-07-12: qty 20

## 2018-07-12 MED ORDER — 0.9 % SODIUM CHLORIDE (POUR BTL) OPTIME
TOPICAL | Status: DC | PRN
  Administered 2018-07-12: 1000 mL

## 2018-07-12 MED ORDER — LIDOCAINE HCL 1 % IJ SOLN
INTRAMUSCULAR | Status: DC | PRN
  Administered 2018-07-12: 20 mL

## 2018-07-12 MED ORDER — BUPIVACAINE HCL (PF) 0.5 % IJ SOLN
INTRAMUSCULAR | Status: AC
  Filled 2018-07-12: qty 30

## 2018-07-12 MED ORDER — HYDROMORPHONE HCL 1 MG/ML IJ SOLN
INTRAMUSCULAR | Status: AC
  Filled 2018-07-12: qty 1

## 2018-07-12 MED ORDER — DEXTROSE 5 % IV SOLN
3.0000 g | INTRAVENOUS | Status: AC
  Administered 2018-07-12: 3 g via INTRAVENOUS

## 2018-07-12 MED ORDER — PROPOFOL 10 MG/ML IV BOLUS
INTRAVENOUS | Status: DC | PRN
  Administered 2018-07-12: 200 mg via INTRAVENOUS
  Administered 2018-07-12: 40 mg via INTRAVENOUS

## 2018-07-12 MED ORDER — LABETALOL HCL 5 MG/ML IV SOLN
INTRAVENOUS | Status: DC | PRN
  Administered 2018-07-12: 5 mg via INTRAVENOUS

## 2018-07-12 MED ORDER — SUCCINYLCHOLINE CHLORIDE 20 MG/ML IJ SOLN
INTRAMUSCULAR | Status: DC | PRN
  Administered 2018-07-12: 180 mg via INTRAVENOUS

## 2018-07-12 MED ORDER — FENTANYL CITRATE (PF) 250 MCG/5ML IJ SOLN
INTRAMUSCULAR | Status: AC
  Filled 2018-07-12: qty 5

## 2018-07-12 MED ORDER — DEXAMETHASONE SODIUM PHOSPHATE 4 MG/ML IJ SOLN
INTRAMUSCULAR | Status: DC | PRN
  Administered 2018-07-12: 8 mg via INTRAVENOUS

## 2018-07-12 MED ORDER — ARTIFICIAL TEARS OPHTHALMIC OINT
TOPICAL_OINTMENT | OPHTHALMIC | Status: AC
  Filled 2018-07-12: qty 3.5

## 2018-07-12 MED ORDER — GLYCOPYRROLATE PF 0.2 MG/ML IJ SOSY
PREFILLED_SYRINGE | INTRAMUSCULAR | Status: DC | PRN
  Administered 2018-07-12: .2 mg via INTRAVENOUS

## 2018-07-12 MED ORDER — CHLORHEXIDINE GLUCONATE CLOTH 2 % EX PADS: 6.0000 | MEDICATED_PAD | Freq: Once | CUTANEOUS | Status: DC

## 2018-07-12 MED ORDER — PROMETHAZINE HCL 25 MG/ML IJ SOLN: 6.2500 mg | INTRAMUSCULAR | Status: DC | PRN

## 2018-07-12 MED ORDER — HYDROMORPHONE HCL 1 MG/ML IJ SOLN
0.2500 mg | INTRAMUSCULAR | Status: DC | PRN
  Administered 2018-07-12 (×2): 0.5 mg via INTRAVENOUS

## 2018-07-12 MED ORDER — ROCURONIUM BROMIDE 10 MG/ML (PF) SYRINGE
PREFILLED_SYRINGE | INTRAVENOUS | Status: AC
  Filled 2018-07-12: qty 10

## 2018-07-12 MED ORDER — GLYCOPYRROLATE PF 0.2 MG/ML IJ SOSY
PREFILLED_SYRINGE | INTRAMUSCULAR | Status: AC
  Filled 2018-07-12: qty 1

## 2018-07-12 MED ORDER — SUGAMMADEX SODIUM 200 MG/2ML IV SOLN
INTRAVENOUS | Status: DC | PRN
  Administered 2018-07-12: 200 mg via INTRAVENOUS

## 2018-07-12 MED ORDER — LIDOCAINE HCL (PF) 1 % IJ SOLN
INTRAMUSCULAR | Status: AC
  Filled 2018-07-12: qty 30

## 2018-07-12 MED ORDER — LACTATED RINGERS IV SOLN
INTRAVENOUS | Status: DC
  Administered 2018-07-12: 1000 mL via INTRAVENOUS
  Administered 2018-07-12: 11:00:00 via INTRAVENOUS

## 2018-07-12 MED ORDER — LIDOCAINE 2% (20 MG/ML) 5 ML SYRINGE
INTRAMUSCULAR | Status: DC | PRN
  Administered 2018-07-12: 40 mg via INTRAVENOUS

## 2018-07-12 MED ORDER — HYDROCODONE-ACETAMINOPHEN 7.5-325 MG PO TABS: 1.0000 | ORAL_TABLET | Freq: Once | ORAL | Status: DC | PRN

## 2018-07-12 MED ORDER — ONDANSETRON HCL 4 MG/2ML IJ SOLN
INTRAMUSCULAR | Status: AC
  Filled 2018-07-12: qty 2

## 2018-07-12 MED ORDER — ACETAMINOPHEN 500 MG PO TABS
1000.0000 mg | ORAL_TABLET | ORAL | Status: AC
  Administered 2018-07-12: 1000 mg via ORAL
  Filled 2018-07-12: qty 2

## 2018-07-12 MED ORDER — FENTANYL CITRATE (PF) 100 MCG/2ML IJ SOLN
INTRAMUSCULAR | Status: DC | PRN
  Administered 2018-07-12: 100 ug via INTRAVENOUS
  Administered 2018-07-12 (×3): 50 ug via INTRAVENOUS

## 2018-07-12 MED ORDER — MIDAZOLAM HCL 2 MG/2ML IJ SOLN
INTRAMUSCULAR | Status: AC
  Filled 2018-07-12: qty 2

## 2018-07-12 SURGICAL SUPPLY — 42 items
ADH SKN CLS APL DERMABOND .7 (GAUZE/BANDAGES/DRESSINGS) ×1
APL PRP STRL LF DISP 70% ISPRP (MISCELLANEOUS) ×2
ATCH SMKEVC FLXB CAUT HNDSWH (FILTER) ×1 IMPLANT
BINDER ABDOMINAL 12 XL 75-84 (SOFTGOODS) ×2 IMPLANT
BLADE SURG SZ11 CARB STEEL (BLADE) ×3 IMPLANT
CANISTER SUCT 1200ML W/VALVE (MISCELLANEOUS) ×3 IMPLANT
CHLORAPREP W/TINT 26 (MISCELLANEOUS) ×5 IMPLANT
COVER WAND RF STERILE (DRAPES) IMPLANT
DERMABOND ADVANCED (GAUZE/BANDAGES/DRESSINGS) ×2
DERMABOND ADVANCED .7 DNX12 (GAUZE/BANDAGES/DRESSINGS) IMPLANT
DEVICE SECURE STRAP 25 ABSORB (INSTRUMENTS) ×3 IMPLANT
DISSECTOR BLUNT TIP ENDO 5MM (MISCELLANEOUS) ×2 IMPLANT
ELECT REM PT RETURN 9FT ADLT (ELECTROSURGICAL) ×3
ELECTRODE REM PT RTRN 9FT ADLT (ELECTROSURGICAL) ×1 IMPLANT
EVACUATOR SMOKE ACCUVAC VALLEY (FILTER) ×2
GLOVE BIO SURGEON STRL SZ7 (GLOVE) ×3 IMPLANT
GLOVE BIOGEL PI IND STRL 7.0 (GLOVE) IMPLANT
GLOVE BIOGEL PI INDICATOR 7.0 (GLOVE) ×8
GLOVE ECLIPSE 6.5 STRL STRAW (GLOVE) ×4 IMPLANT
GLOVE INDICATOR 7.5 STRL GRN (GLOVE) ×3 IMPLANT
GOWN STRL REUS W/ TWL LRG LVL3 (GOWN DISPOSABLE) ×1 IMPLANT
GOWN STRL REUS W/ TWL XL LVL3 (GOWN DISPOSABLE) ×1 IMPLANT
GOWN STRL REUS W/TWL LRG LVL3 (GOWN DISPOSABLE) ×3
GOWN STRL REUS W/TWL XL LVL3 (GOWN DISPOSABLE) ×3
GRASPER SUT TROCAR 14GX15 (MISCELLANEOUS) ×3 IMPLANT
KIT TURNOVER KIT A (KITS) ×3 IMPLANT
MANIFOLD NEPTUNE II (INSTRUMENTS) ×3 IMPLANT
MESH VENTRALIGHT ST 4X6IN (Mesh General) ×2 IMPLANT
NEEDLE HYPO 22GX1.5 SAFETY (NEEDLE) ×3 IMPLANT
NEEDLE VERESS 14GA 120MM (NEEDLE) ×3 IMPLANT
PACK LAP CHOLE LZT030E (CUSTOM PROCEDURE TRAY) ×3 IMPLANT
PAD ARMBOARD 7.5X6 YLW CONV (MISCELLANEOUS) ×3 IMPLANT
SLEEVE ENDOPATH XCEL 5M (ENDOMECHANICALS) ×3 IMPLANT
SUT MNCRL AB 4-0 PS2 18 (SUTURE) ×2 IMPLANT
SUT VIC AB 3-0 SH 27 (SUTURE) ×3
SUT VIC AB 3-0 SH 27X BRD (SUTURE) ×1 IMPLANT
SUT VICRYL 0 UR6 27IN ABS (SUTURE) ×3 IMPLANT
SYR 10ML LL (SYRINGE) ×6 IMPLANT
TRAY FOLEY CATH 16FR SILVER (SET/KITS/TRAYS/PACK) ×2 IMPLANT
TROCAR XCEL 12X100 BLDLESS (ENDOMECHANICALS) ×3 IMPLANT
TROCAR XCEL NON-BLD 5MMX100MML (ENDOMECHANICALS) ×3 IMPLANT
TROCAR XCEL UNIV SLVE 11M 100M (ENDOMECHANICALS) ×2 IMPLANT

## 2018-07-12 NOTE — Discharge Instructions (Addendum)
In addition to included general post-operative instructions for Laparoscopic Repair of Large Incisional Abdominal Wall Hernia with mesh,  Diet: Resume home heart healthy diet.   Activity: No heavy lifting >15 pounds (children, pets, laundry, garbage) or strenuous activity until follow-up in 2 weeks, but light activity and walking are encouraged. Do not drive or drink alcohol if taking narcotic pain medications or having pain that might distract from driving.  Wound care: You may shower/get incisions wet with soapy water and pat dry (do not rub incisions), but no baths or submerging incision underwater until follow-up. Please continue to wear abdominal wall binder every day from morning until bed (should remove when sleeping), and please try to keep small incision site under panus/skin folds clean and dry (may apply gauze/towel).  Medications: Resume all home medications. For mild to moderate pain: acetaminophen (Tylenol) or ibuprofen/naproxen (if no kidney disease). Combining Tylenol with alcohol can substantially increase your risk of causing liver disease. Narcotic pain medications, if prescribed, can be used for severe pain, though may cause nausea, constipation, and drowsiness. Do not combine Tylenol and Percocet (or similar) within a 6 hour period as Percocet (and similar) contain(s) Tylenol. If you do not need the narcotic pain medication, you do not need to fill the prescription.  Call office [phone]) at any time if any questions, worsening pain, fevers/chills, bleeding, drainage from incision site, or other concerns.    General Anesthesia, Adult, Care After This sheet gives you information about how to care for yourself after your procedure. Your health care provider may also give you more specific instructions. If you have problems or questions, contact your health care provider. What can I expect after the procedure? After the procedure, the following side effects are common:  Pain  or discomfort at the IV site.  Nausea.  Vomiting.  Sore throat.  Trouble concentrating.  Feeling cold or chills.  Weak or tired.  Sleepiness and fatigue.  Soreness and body aches. These side effects can affect parts of the body that were not involved in surgery. Follow these instructions at home:  For at least 24 hours after the procedure:  Have a responsible adult stay with you. It is important to have someone help care for you until you are awake and alert.  Rest as needed.  Do not: ? Participate in activities in which you could fall or become injured. ? Drive. ? Use heavy machinery. ? Drink alcohol. ? Take sleeping pills or medicines that cause drowsiness. ? Make important decisions or sign legal documents. ? Take care of children on your own. Eating and drinking  Follow any instructions from your health care provider about eating or drinking restrictions.  When you feel hungry, start by eating small amounts of foods that are soft and easy to digest (bland), such as toast. Gradually return to your regular diet.  Drink enough fluid to keep your urine pale yellow.  If you vomit, rehydrate by drinking water, juice, or clear broth. General instructions  If you have sleep apnea, surgery and certain medicines can increase your risk for breathing problems. Follow instructions from your health care provider about wearing your sleep device: ? Anytime you are sleeping, including during daytime naps. ? While taking prescription pain medicines, sleeping medicines, or medicines that make you drowsy.  Return to your normal activities as told by your health care provider. Ask your health care provider what activities are safe for you.  Take over-the-counter and prescription medicines only as told by your health  care provider.  If you smoke, do not smoke without supervision.  Keep all follow-up visits as told by your health care provider. This is important. Contact a health  care provider if:  You have nausea or vomiting that does not get better with medicine.  You cannot eat or drink without vomiting.  You have pain that does not get better with medicine.  You are unable to pass urine.  You develop a skin rash.  You have a fever.  You have redness around your IV site that gets worse. Get help right away if:  You have difficulty breathing.  You have chest pain.  You have blood in your urine or stool, or you vomit blood. Summary  After the procedure, it is common to have a sore throat or nausea. It is also common to feel tired.  Have a responsible adult stay with you for the first 24 hours after general anesthesia. It is important to have someone help care for you until you are awake and alert.  When you feel hungry, start by eating small amounts of foods that are soft and easy to digest (bland), such as toast. Gradually return to your regular diet.  Drink enough fluid to keep your urine pale yellow.  Return to your normal activities as told by your health care provider. Ask your health care provider what activities are safe for you. This information is not intended to replace advice given to you by your health care provider. Make sure you discuss any questions you have with your health care provider. Document Released: 04/19/2000 Document Revised: 08/27/2016 Document Reviewed: 08/27/2016 Elsevier Interactive Patient Education  2019 [employer].

## 2018-07-12 NOTE — Anesthesia Preprocedure Evaluation (Signed)
Anesthesia Evaluation  Patient identified by MRN, date of birth, ID band Patient awake    Reviewed: Allergy & Precautions, NPO status , Patient's Chart, lab work & pertinent test results  History of Anesthesia Complications (+) PROLONGED EMERGENCE and history of anesthetic complications  Airway Mallampati: II  TM Distance: >3 FB Neck ROM: Full    Dental no notable dental hx. (+) Teeth Intact   Pulmonary neg pulmonary ROS, former smoker,  Denies Pulm issues  Never formally tested for OSA Snores    Pulmonary exam normal breath sounds clear to auscultation       Cardiovascular Exercise Tolerance: Good negative cardio ROS Normal cardiovascular examI Rhythm:Regular Rate:Normal     Neuro/Psych Anxiety negative neurological ROS  negative psych ROS   GI/Hepatic Neg liver ROS, GERD  ,Denies GERD Sx today or any chronic meds for GERD  States occasional OTCs   Endo/Other  Morbid obesityBMI>45  Renal/GU negative Renal ROS  negative genitourinary   Musculoskeletal negative musculoskeletal ROS (+)   Abdominal   Peds negative pediatric ROS (+)  Hematology negative hematology ROS (+) anemia ,   Anesthesia Other Findings   Reproductive/Obstetrics negative OB ROS                             Anesthesia Physical Anesthesia Plan  ASA: III  Anesthesia Plan: General   Post-op Pain Management:    Induction: Intravenous  PONV Risk Score and Plan: 3 and Dexamethasone, Ondansetron and Treatment may vary due to age or medical condition  Airway Management Planned: Oral ETT  Additional Equipment:   Intra-op Plan:   Post-operative Plan: Extubation in OR  Informed Consent: I have reviewed the patients History and Physical, chart, labs and discussed the procedure including the risks, benefits and alternatives for the proposed anesthesia with the patient or authorized representative who has indicated  his/her understanding and acceptance.     Dental advisory given  Plan Discussed with: CRNA  Anesthesia Plan Comments: (Plan Full PPE use  Plan GETA )        Anesthesia Quick Evaluation

## 2018-07-12 NOTE — Anesthesia Procedure Notes (Signed)
Procedure Name: Intubation Date/Time: 07/12/2018 10:11 AM Performed by: Andree Elk, Amy A, CRNA Pre-anesthesia Checklist: Patient identified, Patient being monitored, Timeout performed, Emergency Drugs available and Suction available Patient Re-evaluated:Patient Re-evaluated prior to induction Oxygen Delivery Method: Circle system utilized Preoxygenation: Pre-oxygenation with 100% oxygen Induction Type: IV induction, Cricoid Pressure applied and Rapid sequence Laryngoscope Size: 3 and Glidescope Grade View: Grade I Tube type: Oral Tube size: 7.0 mm Number of attempts: 1 Airway Equipment and Method: Stylet Placement Confirmation: ETT inserted through vocal cords under direct vision,  positive ETCO2 and breath sounds checked- equal and bilateral Secured at: 21 cm Tube secured with: Tape Dental Injury: Teeth and Oropharynx as per pre-operative assessment

## 2018-07-12 NOTE — Op Note (Addendum)
SURGICAL OPERATIVE REPORT   DATE OF PROCEDURE: 07/12/2018  ATTENDING Surgeon(s): Ancil LinseyJason Evan , MD  ASSISTANT(S): Franky MachoMark Jenkins, MD  ANESTHESIA: GETA  PRE-OPERATIVE DIAGNOSIS: Large and incarcerated 4 cm x 4 cm RLQ incisional ventral abdominal wall hernia, containing omentum and small intestine on prior abdominal CT (icd-10's: K43.2)   POST-OPERATIVE DIAGNOSIS: Large and incarcerated 4 cm x 4 cm RLQ incisional ventral abdominal wall hernia, containing omentum and small intestine on prior abdominal CT (icd-10's: K43.2)   PROCEDURE(S): (cpt: [id number]) 1.) Laparoscopic repair of patient's large and incarcerated ventral abdominal wall hernia with 10.2 cm x 15.2 cm Bard Ventralight ST single-side coated mesh for formerly incarcerated umbilical hernia associated with SBO   INTRAOPERATIVE FINDINGS: Large incarcerated 4 cm x 4 cm RLQ incisional ventral abdominal wall hernia, containing small intestine and omentum at the time of surgery (likely at least partially reduced by insufflated pneumoperitoneum)   INTRAVENOUS FLUIDS: 1500 mL crystalloid    ESTIMATED BLOOD LOSS: Minimal (<20 mL)   URINE OUTPUT: 300 mL   SPECIMENS: No Specimen   IMPLANTS: Bard 10.2 cm x 15.2 cm Ventralight ST coated mesh   DRAINS: none   COMPLICATIONS: None apparent   CONDITION AT END OF PROCEDURE: Hemodynamically stable and extubated   DISPOSITION OF PATIENT: PACU   INDICATIONS FOR PROCEDURE:  Patient is a 38 y.o. morbidly obese female who developed a painful RLQ abdominal wall asymmetry/bulge following a prior caesarian section. CT demonstrated a large RLQ ventral abdominal wall hernia containing non-obstructed intestine and omentum, for which patient sought repair. All risks, benefits, and alternatives to above procedure were discussed with the patient, all of patient's questions were answered to his expressed satisfaction, and informed consent was obtained and documented.   DETAILS OF PROCEDURE: Patient  was brought to the operating suite and appropriately identified. General anesthesia was administered along with appropriate pre-operative antibiotics, and endotracheal intubation was performed by anesthetist. In supine position, operative site was prepped and draped in the usual sterile fashion, and following a brief time out, local anesthetic was injected subcutaneously over Palmer's point 3 cm inferior to the subcostal margin along the mid-clavicular line, at which point a 5 mm incision was made using a #11 blade scalpel. Fascia was elevated, and a Verress needle was inserted, and its proper position was confirmed using aspiration and saline meniscus test.   Upon insufflation of the abdominal cavity with carbon dioxide to a well-tolerated pressure of 12-15 mmHg, a 5 mm LUQ port followed by a 5 mm 30-degree laparoscope were inserted and used to inspect the abdominal cavity and its contents with no injuries from insertion of the first trochar noted and confirmation of pre-operative diagnosis. Two additional trocars were inserted along the Left/Right side of the abdomen, the middle one being a 12 mm port (through which to insert the hernia repair mesh) and the lower one a second 5 mm port. The hernia contents were then carefully inspected, lysis of omental adhesions to the anterior abdominal wall was performed using Harmonic Scalpel, and hernia contents (including omentum and incarcerated non-obstructed small intestine were carefully and systematically reduced laparoscopically without resection of incarcerated omentum or hernia sac. Upon release from the anterior abdominal wall hernia site, involved bowel was closely re-inspected with no evidence of serosal tear or thermal injury. Local anesthetic injection needle was then used to percutaneously identify superior, inferior, and two lateral sites which were each marked for placement of lateral anchoring sutures with at least 2 - 3 cm mesh extension beyond the  the  margins of the hernia defect, despite limited space between inferior hernia margin and patient's decompressed bladder. Appropriately sized mesh was selected, the inward-facing coated side of the mesh was marked to designate 4 quadrants, and a 0-0 Ethibond braided non-absorbable suture was secured at least 1 cm from each of 4 corners to serve as trans-fascial anchoring sutures. The mesh was rolled and inserted through the 12 mm port into the abdominal cavity, where it was unrolled, re-oriented, and ensured that the non-stick coated side remained inward-facing.   Endoclose laparoscopic fascial closure device was advanced through 2 mm incisions made at each of 4 markings made to signify the hernia repair mesh edges (at least 1 cm away from Ethibond sutures placed at least 1 cm from the edge of the mesh) and used to externalize the anchoring sutures, which were each tied at one time after all had been externalized and upon confirmation of enough tension on the mesh in a trampoline-like fashion. Secure Strap absorbable laparoscopic tacking device was then used to circumferentially secure the edges of the mesh. An additional 5 mm Right-sided port was not required to facilitate circumferential tacking considering the hernia's RLQ location. Hemostasis and secure placement of hernia repair mesh were confirmed, and intra-peritoneal cavity was inspected with no additional findings.   Endoclose laparoscopic fascial closure device was then used to re-approximate fascia at the 12 mm port site. All ports were then removed under direct visualization, and the abdominal cavity was desuflated. All port sites were irrigated/cleaned, additional local anesthetic was injected at each incision, 3-0 Vicryl was used to re-approximate dermis at 12 mm port site, and subcuticular 4-0 Monocryl suture was used to re-approximate skin. Skin was then cleaned, dried, and sterile Dermabond skin glue was applied, as was an abdominal binder. Patient  was then safely able to be extubated, awakened, and transferred to PACU for post-operative monitoring and care.   I was present for all aspects of the above procedure, and no operative complications were apparent.

## 2018-07-12 NOTE — Anesthesia Postprocedure Evaluation (Signed)
Anesthesia Post Note  Patient: Laura Benton  Procedure(s) Performed: LAPAROSCOPIC INCISIONAL ABDOMINAL WALL HERNIA REPAIR (N/A Abdomen)  Patient location during evaluation: PACU Anesthesia Type: General Level of consciousness: awake and alert and oriented Pain management: pain level controlled Vital Signs Assessment: post-procedure vital signs reviewed and stable Respiratory status: spontaneous breathing, nonlabored ventilation, respiratory function stable and patient connected to nasal cannula oxygen Cardiovascular status: stable Postop Assessment: no apparent nausea or vomiting Anesthetic complications: no     Last Vitals:  Vitals:   07/12/18 0839  BP: (!) 157/98  Pulse: 82  Resp: 18  Temp: 37.1 C  SpO2: 100%    Last Pain:  Vitals:   07/12/18 0839  TempSrc: Oral  PainSc: 0-No pain                 Othon Guardia

## 2018-07-12 NOTE — Interval H&P Note (Signed)
History and Physical Interval Note:  07/12/2018 9:34 AM  Laura Benton  has presented today for surgery, with the diagnosis of incisional abdominal wall hernia.  The various methods of treatment have been discussed with the patient and family. After consideration of risks, benefits and other options for treatment, the patient has consented to  Procedure(s): [place] (N/A) as a surgical intervention.  The patient's history has been reviewed, patient examined, no change in status, stable for surgery.  I have reviewed the patient's chart and labs.  Questions were answered to the patient's satisfaction.     Vickie Epley

## 2018-07-12 NOTE — Transfer of Care (Signed)
Immediate Anesthesia Transfer of Care Note  Patient: Laura Benton  Procedure(s) Performed: LAPAROSCOPIC INCISIONAL ABDOMINAL WALL HERNIA REPAIR (N/A Abdomen)  Patient Location: PACU  Anesthesia Type:General  Level of Consciousness: awake, alert  and oriented  Airway & Oxygen Therapy: Patient Spontanous Breathing and Patient connected to nasal cannula oxygen  Post-op Assessment: Report given to RN and Post -op Vital signs reviewed and stable  Post vital signs: Reviewed and stable  Last Vitals:  Vitals Value Taken Time  BP    Temp    Pulse    Resp    SpO2      Last Pain:  Vitals:   07/12/18 0839  TempSrc: Oral  PainSc: 0-No pain      Patients Stated Pain Goal: 5 (86/38/17 7116)  Complications: No apparent anesthesia complications

## 2018-07-13 ENCOUNTER — Encounter (HOSPITAL_COMMUNITY): Payer: Self-pay | Admitting: Surgery

## 2018-07-13 NOTE — Addendum Note (Signed)
Addendum  created 07/13/18 0944 by Ollen Bowl, CRNA   Charge Capture section accepted

## 2018-07-14 ENCOUNTER — Telehealth: Payer: Self-pay | Admitting: *Deleted

## 2018-07-14 NOTE — Telephone Encounter (Signed)
Patient called and stated that she is still in pain from surgery on 07/12/18, she is taking the oxycodone but its not helping. Please call and advise

## 2018-07-14 NOTE — Telephone Encounter (Signed)
Spoke with patient. She states she has not had a bowel movement since surgery. We discussed trying Miralax and Colace daily and to increase water intake. We also discussed taking ibuprofen with the oxycodone to see if this helps with the pain and discomfort she is having. Patient to call back on Monday to let us know if this does not help.

## 2018-07-20 ENCOUNTER — Telehealth: Payer: Self-pay | Admitting: Surgery

## 2018-07-20 ENCOUNTER — Encounter: Payer: Medicaid Other | Admitting: Surgery

## 2018-07-20 NOTE — Telephone Encounter (Signed)
Tried reaching patient to follow up regarding medication refill and to offer an appointment . No answer. Phone continued to ring.

## 2018-07-20 NOTE — Telephone Encounter (Signed)
Patient has called and is requesting a refill on her pain medication. Patient is s/p ventral hernia repair by Dr Rosana Hoes on 07/12/18. She complains of abdominal pain and that it is sometimes hard for her to even get out of the bed at times, however she states that it is not "too bad". I did ask if she could come in today to see Dr Rosana Hoes, however she stated that she could not get a ride to the office.   Please call patient at  [phone].

## 2018-07-25 NOTE — Telephone Encounter (Signed)
Patient called you back and stated that she wants you to give her a call back

## 2018-07-25 NOTE — Telephone Encounter (Signed)
Tried reaching patient, no answer, unable to leave message. Also tried husbands number as well, unable to leave message.

## 2018-07-25 NOTE — Telephone Encounter (Signed)
Spoke with patient. She states the pain has improved. However she is still sore. We discussed moving about during the day and to rest when needed.  She was encouraged to keep her appointment 07/27/18. She denies fever, chills, nausea vomiting at this time.

## 2018-07-27 ENCOUNTER — Encounter: Payer: Self-pay | Admitting: Surgery

## 2018-07-27 ENCOUNTER — Encounter: Payer: Medicaid Other | Admitting: Surgery

## 2018-07-27 ENCOUNTER — Other Ambulatory Visit: Payer: Self-pay

## 2018-07-27 ENCOUNTER — Ambulatory Visit (INDEPENDENT_AMBULATORY_CARE_PROVIDER_SITE_OTHER): Payer: Self-pay | Admitting: Surgery

## 2018-07-27 VITALS — BP 162/104 | HR 84 | Temp 97.7°F | Ht 65.0 in | Wt 260.0 lb

## 2018-07-27 DIAGNOSIS — K432 Incisional hernia without obstruction or gangrene: Secondary | ICD-10-CM

## 2018-07-27 DIAGNOSIS — Z4889 Encounter for other specified surgical aftercare: Secondary | ICD-10-CM

## 2018-07-27 NOTE — Progress Notes (Signed)
Surgical Clinic Progress/Follow-up Note   HPI:  38 y.o. Female presents to clinic for post-op follow-up 15 Days s/p laparoscopic repair of her ventral lower abdominal incisional hernia with mesh Laura Benton, 07/12/2018). Patient initially called our office for peri-operative constipation and pain, but now reports complete resolution of her pre-operative pain and near-complete resolution of her minimal peri-operative peri-incisional pain. She reports she has been wearing her provided abdominal binder and says it has helped noticeably, and she has been tolerating regular diet with +flatus and normal BM's, denies N/V, fever/chills, CP, or SOB. When asked if she can tell the difference between the pain and focal bulge / asymmetry she experienced pre-operatively and now, she eagerly responds that she "definitely" feels much better than she had prior to surgery and feels happy with her results.  Review of Systems:  Constitutional: denies fever/chills  Respiratory: denies shortness of breath, wheezing  Cardiovascular: denies chest pain, palpitations  Gastrointestinal: abdominal pain, N/V, and bowel function as per interval history Skin: Denies any other rashes or skin discolorations except post-surgical wounds as per interval history  Vital Signs:  BP (!) 162/104   Pulse 84   Temp 97.7 F (36.5 C)   Ht 5\' 5"  (1.651 m)   Wt 260 lb (117.9 kg)   LMP 07/25/2018 (Exact Date)   SpO2 96%   BMI 43.27 kg/m    Physical Exam:  Constitutional:  -- Obese body habitus  -- Awake, alert, and oriented x3  Pulmonary:  -- No crackles -- Equal breath sounds bilaterally -- Breathing non-labored at rest Cardiovascular:  -- S1, S2 present  -- No pericardial rubs  Gastrointestinal:  -- Soft and obese, but non-distended, without appreciable tenderness to palpation, no guarding/rebound tenderness -- Post-surgical incisions all well-approximated without any peri-incisional erythema or drainage -- No abdominal  masses appreciated, pulsatile or otherwise, except moderate RLQ non-tender to palpation firmness without surrounding erythema or ecchymosis, possibly concerning for a seroma, but obscured by body habitus Musculoskeletal / Integumentary:  -- Wounds or skin discoloration: None appreciated except post-surgical incisions as described above (GI) -- Extremities: B/L UE and LE FROM, hands and feet warm  Imaging: No new pertinent imaging available for review   Assessment:  38 y.o. yo Female with a problem list including...  Patient Active Problem List   Diagnosis Date Noted  . Incisional hernia, without obstruction or gangrene 03/31/2018  . Abdominal pain 06/10/2017    presents to clinic for post-op follow-up evaluation, doing overall well 15 Days s/p laparoscopic repair of her ventral lower abdominal incisional hernia with mesh Laura Benton, 07/12/2018).  Plan:              - advance diet as tolerated             - okay to submerge incisions under water (baths, swimming) prn  - continue wearing abdominal binder during daytime up to 6 weeks  - no heavy lifting >40 lbs x 4 more weeks, after which may gradually resume activities without restrictions             - apply sunblock particularly to incisions with sun exposure to reduce pigmentation of scars             - return to clinic as needed, instructed to call office if any questions or concerns  All of the above recommendations were discussed with the patient, and all of patient's questions were answered to her expressed satisfaction.  -- Marilynne Drivers Laura Hoes, MD, [place]: [facility] - Partnering for exceptional care. Office: [phone]

## 2018-07-27 NOTE — Patient Instructions (Signed)
   Follow-up with our office as needed.  Please call and ask to speak with a nurse if you develop questions or concerns.  

## 2018-08-11 ENCOUNTER — Encounter: Payer: Self-pay | Admitting: Emergency Medicine

## 2018-08-11 ENCOUNTER — Other Ambulatory Visit: Payer: Self-pay

## 2018-08-11 ENCOUNTER — Ambulatory Visit
Admission: EM | Admit: 2018-08-11 | Discharge: 2018-08-11 | Disposition: A | Discharge status: Home / Self Care | Payer: Medicaid Other | Attending: Family Medicine | Admitting: Family Medicine

## 2018-08-11 DIAGNOSIS — Z800 Family history of malignant neoplasm of digestive organs: Secondary | ICD-10-CM | POA: Insufficient documentation

## 2018-08-11 DIAGNOSIS — R439 Unspecified disturbances of smell and taste: Secondary | ICD-10-CM | POA: Diagnosis not present

## 2018-08-11 DIAGNOSIS — Z87891 Personal history of nicotine dependence: Secondary | ICD-10-CM | POA: Insufficient documentation

## 2018-08-11 DIAGNOSIS — R6889 Other general symptoms and signs: Secondary | ICD-10-CM

## 2018-08-11 DIAGNOSIS — R51 Headache: Secondary | ICD-10-CM | POA: Diagnosis not present

## 2018-08-11 DIAGNOSIS — Z20828 Contact with and (suspected) exposure to other viral communicable diseases: Secondary | ICD-10-CM | POA: Insufficient documentation

## 2018-08-11 DIAGNOSIS — R0982 Postnasal drip: Secondary | ICD-10-CM | POA: Insufficient documentation

## 2018-08-11 DIAGNOSIS — Z20822 Contact with and (suspected) exposure to covid-19: Secondary | ICD-10-CM

## 2018-08-11 DIAGNOSIS — R05 Cough: Secondary | ICD-10-CM

## 2018-08-11 MED ORDER — ALBUTEROL SULFATE HFA 108 (90 BASE) MCG/ACT IN AERS: 1.0000 | INHALATION_SPRAY | Freq: Four times a day (QID) | RESPIRATORY_TRACT | 0 refills | Status: DC | PRN

## 2018-08-11 MED ORDER — BENZONATATE 200 MG PO CAPS: 200.0000 mg | ORAL_CAPSULE | Freq: Three times a day (TID) | ORAL | 0 refills | Status: DC | PRN

## 2018-08-11 NOTE — ED Provider Notes (Signed)
[facility]    CSN: [id number] Arrival date & time: 08/11/18  1140  History   Chief Complaint Chief Complaint  Patient presents with  . Headache  . Sinus Problem  . Cough   HPI  38 year old female presents with the above complaints.  Patient reports that she has not been feeling well for the past week.  Patient reports that she was exposed to her nephew who has had similar symptoms.  She reports facial pain and pressure, postnasal drip, decreased/loss of smell and taste.  She also reports chills and fatigue.  No documented fever.  She states that it hurts to take a deep breath.  Mild cough.  She reports chest congestion.  No medications or interventions tried.  Patient is concerned that she may have a sinus infection.  No known exacerbating factors.  No other associated symptoms.  No other complaints.  PMH, Surgical Hx, Family Hx, Social History reviewed and updated as below.  Past Medical History:  Diagnosis Date  . Anemia   . Anxiety    no meds  . Complication of anesthesia    hard to wake up  . Elevated blood pressure reading   . GERD (gastroesophageal reflux disease)    no meds  . Heart murmur    early 20's  . Incisional hernia, without obstruction or gangrene 03/31/2018  . Vaginal Pap smear, abnormal     Patient Active Problem List   Diagnosis Date Noted  . Abdominal pain 06/10/2017    Past Surgical History:  Procedure Laterality Date  . CESAREAN SECTION     x2  . HERNIA REPAIR    . INCISIONAL HERNIA REPAIR N/A 07/12/2018   Procedure: LAPAROSCOPIC INCISIONAL ABDOMINAL WALL HERNIA REPAIR;  Surgeon: Vickie Epley, MD;  Location: [facility];  Service: General;  Laterality: N/A;  . right shoulder surgery    . TONSILLECTOMY    . UTERINE FIBROID SURGERY      OB History    Gravida  2   Para  1   Term  1   Preterm      AB      Living  1     SAB      TAB      Ectopic      Multiple      Live Births  1            Home Medications     Prior to Admission medications   Medication Sig Start Date End Date Taking? Authorizing Provider  albuterol (VENTOLIN HFA) 108 (90 Base) MCG/ACT inhaler Inhale 1-2 puffs into the lungs every 6 (six) hours as needed for shortness of breath (Chest tightness). 08/11/18   Coral Spikes, DO  benzonatate (TESSALON) 200 MG capsule Take 1 capsule (200 mg total) by mouth 3 (three) times daily as needed for cough. 08/11/18   Coral Spikes, DO    Family History Family History  Problem Relation Age of Onset  . Throat cancer Father   . Anemia Sister   . Pulmonary disease Son     Social History Social History   Tobacco Use  . Smoking status: Former Smoker    Years: 4.00    Types: Cigarettes, Cigars    Quit date: 04/10/2016    Years since quitting: 2.3  . Smokeless tobacco: Never Used  . Tobacco comment: black and milds  Substance Use Topics  . Alcohol use: Yes    Comment: beer/liqour qd  . Drug use:  Never     Allergies   Patient has no known allergies.   Review of Systems Review of Systems  Constitutional: Positive for chills and fatigue. Negative for fever.  HENT: Positive for postnasal drip, sinus pressure and sinus pain.   Respiratory: Positive for cough and chest tightness.    Physical Exam Triage Vital Signs ED Triage Vitals  Enc Vitals Group     BP 08/11/18 1153 119/81     Pulse Rate 08/11/18 1153 97     Resp 08/11/18 1153 16     Temp 08/11/18 1153 98.9 F (37.2 C)     Temp Source 08/11/18 1153 Oral     SpO2 08/11/18 1153 97 %     Weight 08/11/18 1150 250 lb (113.4 kg)     Height 08/11/18 1150 5\' 5"  (1.651 m)     Head Circumference --      Peak Flow --      Pain Score 08/11/18 1150 0     Pain Loc --      Pain Edu? --      Excl. in GC? --    Updated Vital Signs BP 119/81 (BP Location: Right Arm)   Pulse 97   Temp 98.9 F (37.2 C) (Oral)   Resp 16   Ht 5\' 5"  (1.651 m)   Wt 113.4 kg   LMP 07/25/2018 (Exact Date)   SpO2 97%   Breastfeeding Yes   BMI  41.60 kg/m   Visual Acuity Right Eye Distance:   Left Eye Distance:   Bilateral Distance:    Right Eye Near:   Left Eye Near:    Bilateral Near:     Physical Exam Vitals signs and nursing note reviewed.  Constitutional:      General: She is not in acute distress.    Appearance: Normal appearance.  HENT:     Head: Normocephalic and atraumatic.     Mouth/Throat:     Pharynx: Oropharynx is clear. No posterior oropharyngeal erythema.  Eyes:     General:        Right eye: No discharge.        Left eye: No discharge.     Conjunctiva/sclera: Conjunctivae normal.  Cardiovascular:     Rate and Rhythm: Normal rate and regular rhythm.     Heart sounds: No murmur.  Pulmonary:     Effort: Pulmonary effort is normal.     Breath sounds: Normal breath sounds. No wheezing, rhonchi or rales.  Neurological:     Mental Status: She is alert.  Psychiatric:        Mood and Affect: Mood normal.        Behavior: Behavior normal.    UC Treatments / Results  Labs (all labs ordered are listed, but only abnormal results are displayed) Labs Reviewed  NOVEL CORONAVIRUS, NAA (HOSPITAL ORDER, SEND-OUT TO REF LAB)    EKG   Radiology No results found.  Procedures Procedures (including critical care time)  Medications Ordered in UC Medications - No data to display  Initial Impression / Assessment and Plan / UC Course  I have reviewed the triage vital signs and the nursing notes.  Pertinent labs & imaging results that were available during my care of the patient were reviewed by me and considered in my medical decision making (see chart for details).    [age] year female presents with symptoms concerning for COVID-19.  Testing sent.  Awaiting results.  Albuterol and Tessalon Perles as needed.  Supportive care.  Final Clinical Impressions(s) / UC Diagnoses   Final diagnoses:  Suspected Covid-19 Virus Infection     Discharge Instructions     Rest. Fluids.  Try and limit your  contacts. I would wear a mask around your daughter.  Medication as prescribed.  Tylenol/Ibuprofen as needed for pain.  Take care  Dr. Adriana Simasook    ED Prescriptions    Medication Sig Dispense Auth. Provider   albuterol (VENTOLIN HFA) 108 (90 Base) MCG/ACT inhaler Inhale 1-2 puffs into the lungs every 6 (six) hours as needed for shortness of breath (Chest tightness). 18 g Kanya Potteiger G, DO   benzonatate (TESSALON) 200 MG capsule Take 1 capsule (200 mg total) by mouth 3 (three) times daily as needed for cough. 20 capsule Tommie Samsook, Avery Klingbeil G, DO     Controlled Substance Prescriptions [place] Controlled Substance Registry consulted? Not Applicable   Tommie SamsCook, Ellison Rieth G, DO 08/11/18 1231

## 2018-08-11 NOTE — ED Triage Notes (Signed)
Patient c/o cough, congestion, HAs, sinus pressure, and loss of taste for about a week. Patient denies fevers.

## 2018-08-11 NOTE — Discharge Instructions (Addendum)
Rest. Fluids.  Try and limit your contacts. I would wear a mask around your daughter.  Medication as prescribed.  Tylenol/Ibuprofen as needed for pain.  Take care  Dr. Lacinda Axon

## 2018-08-14 ENCOUNTER — Telehealth (HOSPITAL_COMMUNITY): Payer: Self-pay | Admitting: Emergency Medicine

## 2018-08-14 NOTE — Telephone Encounter (Signed)
Your test for COVID-19 was positive, meaning that you were infected with the novel coronavirus and could give the germ to others.  Please continue isolation at home, for at least 10 days since the start of your fever/cough/breathlessness and until you have had 3 consecutive days without fever (without taking a fever reducer) and with cough/breathlessness improving. Please continue good preventive care measures, including:  frequent hand-washing, avoid touching your face, cover coughs/sneezes, stay out of crowds and keep a 6 foot distance from others.  Recheck or go to the nearest hospital ED tent for re-assessment if fever/cough/breathlessness return.  Pt contact, given resources, all questions answered.

## 2018-08-15 LAB — NOVEL CORONAVIRUS, NAA (HOSP ORDER, SEND-OUT TO REF LAB; TAT 18-24 HRS): SARS-CoV-2, NAA: DETECTED — AB

## 2018-11-25 ENCOUNTER — Ambulatory Visit: Admit: 2018-11-25 | Payer: Medicaid Other | Admitting: Surgery

## 2018-11-25 SURGERY — REPAIR, HERNIA, INCISIONAL, LAPAROSCOPIC
Anesthesia: General

## 2019-07-12 ENCOUNTER — Other Ambulatory Visit: Payer: Self-pay

## 2019-07-12 ENCOUNTER — Ambulatory Visit (INDEPENDENT_AMBULATORY_CARE_PROVIDER_SITE_OTHER): Payer: Medicaid Other | Admitting: Surgery

## 2019-07-12 ENCOUNTER — Encounter: Payer: Self-pay | Admitting: Surgery

## 2019-07-12 VITALS — BP 147/102 | HR 94 | Temp 97.9°F | Ht 65.0 in | Wt 243.0 lb

## 2019-07-12 DIAGNOSIS — Z720 Tobacco use: Secondary | ICD-10-CM | POA: Diagnosis not present

## 2019-07-12 DIAGNOSIS — K432 Incisional hernia without obstruction or gangrene: Secondary | ICD-10-CM

## 2019-07-12 DIAGNOSIS — R1084 Generalized abdominal pain: Secondary | ICD-10-CM | POA: Diagnosis not present

## 2019-07-12 HISTORY — DX: Incisional hernia without obstruction or gangrene: K43.2

## 2019-07-12 NOTE — Patient Instructions (Addendum)
We will schedule you for a CT of the abdomen. We will call you with your results and your next steps.   You may use a pillow against your abdomen if you need to cough or sneeze. Try to avoid any heavy lifting.  Try to quit smoking and reduce weight.  You are scheduled for a CT abdomen/pelvis with contrast at [place] on Thursday June 24th at 11:00 am. Dennis Bast will need to arrive there by 10:45 am. Prep: NPO 4 hours prior and pick up prep kit. Patient verbalizes understanding.

## 2019-07-12 NOTE — Progress Notes (Signed)
Patient ID: Laura Benton, female   DOB: [birth year], 39 y.o.   MRN: [id number]  Chief Complaint: Abdominal pain  History of Present Illness Laura Benton is a 39 y.o. female with 3-day history of abdominal pain, pain is localized to area of prior incisional hernia repair.  Today is her 1 year anniversary of this laparoscopic repair.  She noted progressive bulging in her right lower quadrant abdomen over the last 3 days with increased associated pain.  Coughing and sneezing exacerbates the pain.  It is tender to her 21-year old daughters touch.  She denies nausea, vomiting, fevers, chills or changes in bowel function.  She denies any problems utilizing the bathroom.  Prior mesh repair completed laparosc[facility] at [facility].  Past Medical History Past Medical History:  Diagnosis Date  . Anemia   . Anxiety    no meds  . Complication of anesthesia    hard to wake up  . Elevated blood pressure reading   . GERD (gastroesophageal reflux disease)    no meds  . Heart murmur    early 20's  . Incisional hernia, without obstruction or gangrene 03/31/2018  . Vaginal Pap smear, abnormal       Past Surgical History:  Procedure Laterality Date  . CESAREAN SECTION     x2  . HERNIA REPAIR    . INCISIONAL HERNIA REPAIR N/A 07/12/2018   Procedure: LAPAROSCOPIC INCISIONAL ABDOMINAL WALL HERNIA REPAIR;  Surgeon: Ancil Linsey, MD;  Location: [facility];  Service: General;  Laterality: N/A;  . right shoulder surgery    . TONSILLECTOMY    . UTERINE FIBROID SURGERY      No Known Allergies  No current outpatient medications on file.   No current facility-administered medications for this visit.    Family History Family History  Problem Relation Age of Onset  . Throat cancer Father   . Anemia Sister   . Pulmonary disease Son       Social History Social History   Tobacco Use  . Smoking status: Current Some Day Smoker    Years: 4.00    Types: Cigars  . Smokeless tobacco:  Never Used  . Tobacco comment: black and milds  Vaping Use  . Vaping Use: Never used  Substance Use Topics  . Alcohol use: Yes    Comment: beer/liqour qd  . Drug use: Never        Review of Systems  Constitutional: Negative.   HENT: Negative.   Eyes: Negative.   Respiratory: Negative.   Cardiovascular: Negative.   Gastrointestinal: Positive for abdominal pain.  Genitourinary: Negative.   Skin: Negative.   Neurological: Negative.       Physical Exam Blood pressure (!) 147/102, pulse 94, temperature 97.9 F (36.6 C), height 5\' 5"  (1.651 m), weight 243 lb (110.2 kg), last menstrual period 06/27/2019, SpO2 98 %, currently breastfeeding. Last Weight  Most recent update: 07/12/2019 11:25 AM   Weight  110.2 kg (243 lb)            CONSTITUTIONAL: Well developed, and nourished, appropriately responsive and aware without distress.   Overweight female. EYES: Sclera non-icteric.   EARS, NOSE, MOUTH AND THROAT: Mask worn.   The oropharynx is clear.   Hearing is intact to voice.  NECK: Trachea is midline, and there is no jugular venous distension.  LYMPH NODES:  Lymph nodes in the neck are not enlarged. RESPIRATORY:  Lungs are clear, and breath sounds are equal bilaterally. Normal respiratory effort without pathologic use  of accessory muscles. CARDIOVASCULAR: Heart is regular in rate and rhythm. GI: The abdomen is tender with a large mass/bulge in the right inferior aspect of her periumbilical area/right lower quadrant, otherwise her abdomen is soft, nontender, and nondistended. There were no palpable masses. I could not not appreciate hepatosplenomegaly. There were normal bowel sounds. MUSCULOSKELETAL:  Symmetrical muscle tone appreciated in all four extremities.    SKIN: Skin turgor is normal. No pathologic skin lesions appreciated.  NEUROLOGIC:  Motor and sensation appear grossly normal.  Cranial nerves are grossly without defect. PSYCH:  Alert and oriented to person, place and  time. Affect is appropriate for situation.  Data Reviewed I have personally reviewed what is currently available of the patient's imaging, recent labs and medical records.   Labs:  CBC Latest Ref Rng & Units 07/06/2018 11/04/2017 04/07/2015  WBC 4.0 - 10.5 K/uL 6.6 5.1 5.8  Hemoglobin 12.0 - 15.0 g/dL 12.8 13.8 13.0  Hematocrit 36 - 46 % 41.1 42.3 40.2  Platelets 150 - 400 K/uL 227 216 253   CMP Latest Ref Rng & Units 07/06/2018 11/04/2017 04/07/2015  Glucose 70 - 99 mg/dL 99 117(H) 103(H)  BUN 6 - 20 mg/dL 11 10 12   Creatinine 0.44 - 1.00 mg/dL 0.89 1.04(H) 1.02(H)  Sodium 135 - 145 mmol/L 135 142 136  Potassium 3.5 - 5.1 mmol/L 4.1 4.7 4.0  Chloride 98 - 111 mmol/L 104 103 103  CO2 22 - 32 mmol/L 21(L) 29 26  Calcium 8.9 - 10.3 mg/dL 8.6(L) 9.2 9.3  Total Protein 6.5 - 8.1 g/dL 7.2 7.6 7.1  Total Bilirubin 0.3 - 1.2 mg/dL 0.3 0.7 0.6  Alkaline Phos 38 - 126 U/L 71 66 46  AST 15 - 41 U/L 40 19 35  ALT 0 - 44 U/L 33 15 24      Imaging:  Within last 24 hrs: No results found.  Assessment    Recurrent incisional hernia.  Patient Active Problem List   Diagnosis Date Noted  . Abdominal pain 06/10/2017    Plan    We discussed the elective nature, hoping to keep any potential intervention elective.  We discussed not wanting to fix her hernia has any more than necessary, noting that complications arise with increasing frequency of repair.  She has resumed smoking after a stressful.  She has gained weight as well.  We discussed both of these factors in terms of her our success in her repair of her recurrent incisional hernia.  I believe she is highly motivated at avoiding another operation and optimizing the next elective repair of this recurrence.  So tobacco cessation is discussed as well as strategies for optimal weight loss. As for now we will obtain a CT scan of her abdomen pelvis to assist with surgical planning and evaluate the degree of the fascial defect.  Hopefully we can  save her another visit and discuss the CT findings over the phone.  Face-to-face time spent with the patient and accompanying care providers(if present) was 30 minutes, with more than 50% of the time spent counseling, educating, and coordinating care of the patient.      Ronny Bacon M.D., FACS 07/12/2019, 12:18 PM

## 2019-07-19 ENCOUNTER — Ambulatory Visit: Admission: RE | Admit: 2019-07-19 | Payer: Medicaid Other | Source: Ambulatory Visit

## 2019-07-26 ENCOUNTER — Ambulatory Visit: Admission: RE | Admit: 2019-07-26 | Payer: Medicaid Other | Source: Ambulatory Visit

## 2019-08-03 ENCOUNTER — Ambulatory Visit
Admission: RE | Admit: 2019-08-03 | Discharge: 2019-08-03 | Disposition: A | Discharge status: Home / Self Care | Payer: Medicaid Other | Source: Ambulatory Visit | Attending: Surgery | Admitting: Surgery

## 2019-08-03 ENCOUNTER — Other Ambulatory Visit: Payer: Self-pay

## 2019-08-03 DIAGNOSIS — K432 Incisional hernia without obstruction or gangrene: Secondary | ICD-10-CM | POA: Insufficient documentation

## 2019-08-03 DIAGNOSIS — R1084 Generalized abdominal pain: Secondary | ICD-10-CM | POA: Diagnosis present

## 2019-08-03 MED ORDER — IOHEXOL 300 MG/ML  SOLN
100.0000 mL | Freq: Once | INTRAMUSCULAR | Status: AC | PRN
  Administered 2019-08-03: 100 mL via INTRAVENOUS

## 2019-08-15 ENCOUNTER — Telehealth: Payer: Self-pay | Admitting: *Deleted

## 2019-08-15 NOTE — Telephone Encounter (Signed)
Per Dr Claudine Mouton states " he will contact tomorrow Thursday 08/16/19 to discuss CT results." Attempted to contact patient, but unable leave voice message due to voicemail not completed.

## 2019-08-15 NOTE — Telephone Encounter (Signed)
Patient is calling and wanting to know results of her CT scan that was done on 08/03/19

## 2019-08-16 NOTE — Telephone Encounter (Signed)
Dr.Rodenberg spoke with patient informed her that there is not evidence of a recurring hernia. He also notified patient that with previous hernia repairs with mesh that the area may fill with fluids and cause a seroma in that area, but there was no signs of bowel in the area of concern per CT results. Patient was advised to continue to try and lose weight to help with healing process, but at this time there's no indication for surgery at this time per Dr.Rodenberg. Patient verbalized understanding and no further questions at this time.

## 2019-11-20 ENCOUNTER — Other Ambulatory Visit: Payer: Self-pay

## 2019-11-20 ENCOUNTER — Ambulatory Visit (INDEPENDENT_AMBULATORY_CARE_PROVIDER_SITE_OTHER): Payer: Medicaid Other | Admitting: Surgery

## 2019-11-20 ENCOUNTER — Encounter: Payer: Self-pay | Admitting: Surgery

## 2019-11-20 VITALS — BP 194/125 | HR 87 | Temp 98.1°F | Resp 12 | Ht 65.0 in | Wt 233.4 lb

## 2019-11-20 DIAGNOSIS — R1031 Right lower quadrant pain: Secondary | ICD-10-CM | POA: Diagnosis not present

## 2019-11-20 NOTE — Progress Notes (Signed)
Patient ID: Laura Benton, female   DOB: 27-May-1980, 39 y.o.   MRN: [id number]  Chief Complaint: Increasing abdominal pain.  History of Present Illness Laura Benton is a 39 y.o. female with complaint of increasing size, and pain at previously prepared lower abdominal ventral hernia repair. Over the last 6 days, her symptoms have become worse. She reported that things had improved after her last visit, CT scan reassured me that her hernia had not come back and that the retained hernia sac/post hernia repair seroma was the cause of her symptoms at that time. However now she has had increased pain over the last week or so, without precipitating factors of any. Again concerned about potential recurrence.  Previously last June: Laura Benton is a 39 y.o. female with 3-day history of abdominal pain, pain is localized to area of prior incisional hernia repair.  Today is her 1 year anniversary of this laparoscopic repair.  She noted progressive bulging in her right lower quadrant abdomen over the last 3 days with increased associated pain.  Coughing and sneezing exacerbates the pain.  It is tender to her 6-year old daughters touch.  She denies nausea, vomiting, fevers, chills or changes in bowel function.  She denies any problems utilizing the bathroom.  Prior mesh repair completed laparoscopically here at [facility].   Past Medical History Past Medical History:  Diagnosis Date  . Anemia   . Anxiety    no meds  . Complication of anesthesia    hard to wake up  . Elevated blood pressure reading   . GERD (gastroesophageal reflux disease)    no meds  . Heart murmur    early 20's  . Incisional hernia, without obstruction or gangrene 03/31/2018  . Vaginal Pap smear, abnormal       Past Surgical History:  Procedure Laterality Date  . CESAREAN SECTION     x2  . HERNIA REPAIR    . INCISIONAL HERNIA REPAIR N/A 07/12/2018   Procedure: LAPAROSCOPIC INCISIONAL ABDOMINAL WALL HERNIA  REPAIR;  Surgeon: Ancil Linsey, MD;  Location: [facility];  Service: General;  Laterality: N/A;  . right shoulder surgery    . TONSILLECTOMY    . UTERINE FIBROID SURGERY      No Known Allergies  No current outpatient medications on file.   No current facility-administered medications for this visit.    Family History Family History  Problem Relation Age of Onset  . Throat cancer Father   . Anemia Sister   . Pulmonary disease Son       Social History Social History   Tobacco Use  . Smoking status: Current Some Day Smoker    Years: 4.00    Types: Cigars  . Smokeless tobacco: Never Used  . Tobacco comment: black and milds  Vaping Use  . Vaping Use: Never used  Substance Use Topics  . Alcohol use: Yes    Comment: beer/liqour qd  . Drug use: Never        Review of Systems  Constitutional: Negative.   HENT: Negative.   Eyes: Negative.   Respiratory: Negative.   Cardiovascular: Negative.   Gastrointestinal: Positive for abdominal pain. Negative for blood in stool, diarrhea, heartburn, melena, nausea and vomiting.  Genitourinary: Negative.   Skin: Negative.   Neurological: Negative.   Psychiatric/Behavioral: Negative.       Physical Exam Blood pressure (!) 194/125, pulse 87, temperature 98.1 F (36.7 C), resp. rate 12, height 5\' 5"  (1.651 m), weight 233 lb 6.4 oz (  105.9 kg), SpO2 97 %, currently breastfeeding. Last Weight  Most recent update: 11/20/2019  3:32 PM   Weight  105.9 kg (233 lb 6.4 oz)            CONSTITUTIONAL: Well developed, and nourished, appropriately responsive and aware without distress.  Obese.   EYES: Sclera non-icteric.   EARS, NOSE, MOUTH AND THROAT: Mask worn.     Hearing is intact to voice.  NECK: Trachea is midline, and there is no jugular venous distension.  LYMPH NODES:  Lymph nodes in the neck are not enlarged. RESPIRATORY:  Lungs are clear, and breath sounds are equal bilaterally. Normal respiratory effort without pathologic  use of accessory muscles. CARDIOVASCULAR: Heart is regular in rate and rhythm. GI: Well established previously appreciated right lower quadrant abdominal wall mass is still present.  It is a bit more tense than I remember it, but it does not diminish.  We reviewed previous CT imaging, confirming that this is consistent with a postoperative seroma.  Otherwise the abdomen is soft, nontender, and nondistended. There were no palpable masses. I did not appreciate hepatosplenomegaly. There were normal bowel sounds. MUSCULOSKELETAL:  Symmetrical muscle tone appreciated in all four extremities.    SKIN: Skin turgor is normal. No pathologic skin lesions appreciated.  NEUROLOGIC:  Motor and sensation appear grossly normal.  Cranial nerves are grossly without defect. PSYCH:  Alert and oriented to person, place and time. Affect is appropriate for situation.  Data Reviewed I have personally reviewed what is currently available of the patient's imaging, recent labs and medical records.   Labs:  CBC Latest Ref Rng & Units 07/06/2018 11/04/2017 04/07/2015  WBC 4.0 - 10.5 K/uL 6.6 5.1 5.8  Hemoglobin 12.0 - 15.0 g/dL 76.7 34.1 93.7  Hematocrit 36 - 46 % 41.1 42.3 40.2  Platelets 150 - 400 K/uL 227 216 253   CMP Latest Ref Rng & Units 07/06/2018 11/04/2017 04/07/2015  Glucose 70 - 99 mg/dL 99 902(I) 097(D)  BUN 6 - 20 mg/dL 11 10 12   Creatinine 0.44 - 1.00 mg/dL 5.32) 9.92(E)  Sodium 135 - 145 mmol/L 135 142 136  Potassium 3.5 - 5.1 mmol/L 4.1 4.7 4.0  Chloride 98 - 111 mmol/L 104 103 103  CO2 22 - 32 mmol/L 21(L) 29 26  Calcium 8.9 - 10.3 mg/dL 2.68(T) 9.2 9.3  Total Protein 6.5 - 8.1 g/dL 7.2 7.6 7.1  Total Bilirubin 0.3 - 1.2 mg/dL 0.3 0.7 0.6  Alkaline Phos 38 - 126 U/L 71 66 46  AST 15 - 41 U/L 40 19 35  ALT 0 - 44 U/L 33 15 24      Imaging: Radiology review: Reviewed the images together with the patient from last June, comparing them with those from October 2019, explaining the presence of  the old hernia sac becoming the current seroma cavity. Within last 24 hrs: No results found.  Assessment    Persistent postoperative seroma following laparoscopic incisional hernia repair with mesh. Patient Active Problem List   Diagnosis Date Noted  . Recurrent incisional hernia 07/12/2019  . Tobacco abuse 07/12/2019  . Morbid obesity (HCC) 07/12/2019  . Abdominal pain 06/10/2017    Plan    We discussed the possibility/risks and benefits of percutaneous drainage.  Risks of infection weighed against those of recurrent seroma.  Radiology wants another set of images prior to considering any intervention.  We discussed intervention in the OR with drain placement but felt this is less efficient than a percutaneous drain  placement. I am still not suspicious that her hernia has recurred but would agree with getting additional imaging.  We will refer her for drain placement should recurrent hernia again be confirmed.  Face-to-face time spent with the patient and accompanying care providers(if present) was 30 minutes, with more than 50% of the time spent counseling, educating, and coordinating care of the patient.      Campbell Lerner M.D., FACS 11/20/2019, 4:06 PM

## 2019-11-20 NOTE — Patient Instructions (Addendum)
Your CT Abdomen and Pelvis with contrast is scheduled for 11/30/19 [facility]. Arrival time 2:15pm. Nothing to eat or drink 4 hours prior. Pick up contrast prior.    We will contact Interventional Radiology to set up a consult for a drain placement. After those appointments we will set up a follow up appointment to come back.  Call the office if you have any questions or concerns.

## 2019-11-30 ENCOUNTER — Ambulatory Visit: Admission: RE | Admit: 2019-11-30 | Payer: Medicaid Other | Source: Ambulatory Visit

## 2020-01-02 NOTE — Progress Notes (Deleted)
New Patient Office Visit  Subjective:  Patient ID: Laura Benton, female    DOB: 1980-12-01  Age: 39 y.o. MRN: [id number]  CC: No chief complaint on file.   HPI Namira Rosekrans presents for ***  Past Medical History:  Diagnosis Date  . Anemia   . Anxiety    no meds  . Complication of anesthesia    hard to wake up  . Elevated blood pressure reading   . GERD (gastroesophageal reflux disease)    no meds  . Heart murmur    early 20's  . Incisional hernia, without obstruction or gangrene 03/31/2018  . Vaginal Pap smear, abnormal     Past Surgical History:  Procedure Laterality Date  . CESAREAN SECTION     x2  . HERNIA REPAIR    . INCISIONAL HERNIA REPAIR N/A 07/12/2018   Procedure: LAPAROSCOPIC INCISIONAL ABDOMINAL WALL HERNIA REPAIR;  Surgeon: Ancil Linsey, MD;  Location: [facility];  Service: General;  Laterality: N/A;  . right shoulder surgery    . TONSILLECTOMY    . UTERINE FIBROID SURGERY      Family History  Problem Relation Age of Onset  . Throat cancer Father   . Anemia Sister   . Pulmonary disease Son     Social History   Socioeconomic History  . Marital status: Married    Spouse name: Not on file  . Number of children: Not on file  . Years of education: Not on file  . Highest education level: Not on file  Occupational History  . Not on file  Tobacco Use  . Smoking status: Current Some Day Smoker    Years: 4.00    Types: Cigars  . Smokeless tobacco: Never Used  . Tobacco comment: black and milds  Vaping Use  . Vaping Use: Never used  Substance and Sexual Activity  . Alcohol use: Yes    Comment: beer/liqour qd  . Drug use: Never  . Sexual activity: Yes    Birth control/protection: None  Other Topics Concern  . Not on file  Social History Narrative  . Not on file   Social Determinants of Health   Financial Resource Strain:   . Difficulty of Paying Living Expenses: Not on file  Food Insecurity:   . Worried About Community education officer in the Last Year: Not on file  . Ran Out of Food in the Last Year: Not on file  Transportation Needs:   . Lack of Transportation (Medical): Not on file  . Lack of Transportation (Non-Medical): Not on file  Physical Activity:   . Days of Exercise per Week: Not on file  . Minutes of Exercise per Session: Not on file  Stress:   . Feeling of Stress : Not on file  Social Connections:   . Frequency of Communication with Friends and Family: Not on file  . Frequency of Social Gatherings with Friends and Family: Not on file  . Attends Religious Services: Not on file  . Active Member of Clubs or Organizations: Not on file  . Attends Banker Meetings: Not on file  . Marital Status: Not on file  Intimate Partner Violence:   . Fear of Current or Ex-Partner: Not on file  . Emotionally Abused: Not on file  . Physically Abused: Not on file  . Sexually Abused: Not on file    ROS Review of Systems  Objective:   Today's Vitals: There were no vitals taken for this visit.  Physical Exam  Assessment & Plan:   Problem List Items Addressed This Visit    None      No outpatient encounter medications on file as of 01/03/2020.   No facility-administered encounter medications on file as of 01/03/2020.    Follow-up: No follow-ups on file.   Jairo Ben, FNP

## 2020-01-03 ENCOUNTER — Ambulatory Visit: Payer: Self-pay | Admitting: Adult Health

## 2020-01-10 ENCOUNTER — Ambulatory Visit: Payer: Medicaid Other

## 2020-01-26 DIAGNOSIS — Z8616 Personal history of COVID-19: Secondary | ICD-10-CM

## 2020-01-26 HISTORY — DX: Personal history of COVID-19: Z86.16

## 2020-01-28 ENCOUNTER — Ambulatory Visit: Payer: Self-pay | Admitting: Adult Health

## 2020-02-18 ENCOUNTER — Encounter: Payer: Self-pay | Admitting: Emergency Medicine

## 2020-02-18 ENCOUNTER — Other Ambulatory Visit: Payer: Self-pay

## 2020-02-18 ENCOUNTER — Ambulatory Visit
Admission: EM | Admit: 2020-02-18 | Discharge: 2020-02-18 | Disposition: A | Discharge status: Home / Self Care | Payer: Medicaid Other | Attending: Family Medicine | Admitting: Family Medicine

## 2020-02-18 DIAGNOSIS — U071 COVID-19: Secondary | ICD-10-CM

## 2020-02-18 DIAGNOSIS — Z20822 Contact with and (suspected) exposure to covid-19: Secondary | ICD-10-CM

## 2020-02-18 DIAGNOSIS — B9789 Other viral agents as the cause of diseases classified elsewhere: Secondary | ICD-10-CM

## 2020-02-18 MED ORDER — BENZONATATE 200 MG PO CAPS: 200.0000 mg | ORAL_CAPSULE | Freq: Three times a day (TID) | ORAL | 0 refills | Status: DC | PRN

## 2020-02-18 MED ORDER — KETOROLAC TROMETHAMINE 10 MG PO TABS: 10.0000 mg | ORAL_TABLET | Freq: Four times a day (QID) | ORAL | 0 refills | Status: DC | PRN

## 2020-02-18 MED ORDER — ALBUTEROL SULFATE HFA 108 (90 BASE) MCG/ACT IN AERS: 1.0000 | INHALATION_SPRAY | Freq: Four times a day (QID) | RESPIRATORY_TRACT | 0 refills | Status: DC | PRN

## 2020-02-18 NOTE — Discharge Instructions (Signed)
Medication as prescribed.  Stay home.  Check my chart for COVID test results.  Take care  Dr. Daivon Rayos   

## 2020-02-18 NOTE — ED Triage Notes (Signed)
Pt presents with cough, body aches, chills, nasal congestion x 2 days.

## 2020-02-19 LAB — SARS CORONAVIRUS 2 (TAT 6-24 HRS): SARS Coronavirus 2: POSITIVE — AB

## 2020-02-19 NOTE — ED Provider Notes (Signed)
[facility]    CSN: [id number] Arrival date & time: 02/18/20  1214      History   Chief Complaint Chief Complaint  Patient presents with  . Cough  . Generalized Body Aches  . Chills   HPI   40 year old female presents with the above complaints.  Symptoms started Sunday.  Patient reports headache, cough, bodyaches, fever, congestion.  Temperature currently 100.2.  Pain is minimal at this time.  No relieving factors.  No known exacerbating factors.  No other reported symptoms.  No other complaints.  Past Medical History:  Diagnosis Date  . Anemia   . Anxiety    no meds  . Complication of anesthesia    hard to wake up  . Elevated blood pressure reading   . GERD (gastroesophageal reflux disease)    no meds  . Heart murmur    early 20's  . Incisional hernia, without obstruction or gangrene 03/31/2018  . Vaginal Pap smear, abnormal     Patient Active Problem List   Diagnosis Date Noted  . Recurrent incisional hernia 07/12/2019  . Tobacco abuse 07/12/2019  . Morbid obesity (HCC) 07/12/2019  . Abdominal pain 06/10/2017    Past Surgical History:  Procedure Laterality Date  . CESAREAN SECTION     x2  . HERNIA REPAIR    . INCISIONAL HERNIA REPAIR N/A 07/12/2018   Procedure: LAPAROSCOPIC INCISIONAL ABDOMINAL WALL HERNIA REPAIR;  Surgeon: Ancil Linsey, MD;  Location: [facility];  Service: General;  Laterality: N/A;  . right shoulder surgery    . TONSILLECTOMY    . UTERINE FIBROID SURGERY      OB History    Gravida  2   Para  1   Term  1   Preterm      AB      Living  1     SAB      IAB      Ectopic      Multiple      Live Births  1            Home Medications    Prior to Admission medications   Medication Sig Start Date End Date Taking? Authorizing Provider  albuterol (VENTOLIN HFA) 108 (90 Base) MCG/ACT inhaler Inhale 1-2 puffs into the lungs every 6 (six) hours as needed for wheezing or shortness of breath. 02/18/20  Yes Laterrica Libman,  Alexandros Ewan G, DO  benzonatate (TESSALON) 200 MG capsule Take 1 capsule (200 mg total) by mouth 3 (three) times daily as needed for cough. 02/18/20  Yes Gracelin Weisberg G, DO  ketorolac (TORADOL) 10 MG tablet Take 1 tablet (10 mg total) by mouth every 6 (six) hours as needed for moderate pain or severe pain. 02/18/20  Yes Tommie Sams, DO    Family History Family History  Problem Relation Age of Onset  . Throat cancer Father   . Anemia Sister   . Pulmonary disease Son     Social History Social History   Tobacco Use  . Smoking status: Current Some Day Smoker    Years: 4.00    Types: Cigars  . Smokeless tobacco: Never Used  . Tobacco comment: black and milds  Vaping Use  . Vaping Use: Never used  Substance Use Topics  . Alcohol use: Yes    Comment: beer/liqour qd  . Drug use: Never     Allergies   Patient has no known allergies.   Review of Systems Review of Systems Per HPI  Physical Exam Triage Vital Signs ED Triage Vitals  Enc Vitals Group     BP 02/18/20 1315 (!) 170/105     Pulse Rate 02/18/20 1315 96     Resp 02/18/20 1315 18     Temp 02/18/20 1315 100.2 F (37.9 C)     Temp Source 02/18/20 1315 Oral     SpO2 02/18/20 1315 100 %     Weight 02/18/20 1317 235 lb (106.6 kg)     Height 02/18/20 1317 5\' 5"  (1.651 m)     Head Circumference --      Peak Flow --      Pain Score 02/18/20 1315 1     Pain Loc --      Pain Edu? --      Excl. in GC? --    Updated Vital Signs BP (!) 170/105 (BP Location: Left Arm)   Pulse 96   Temp 100.2 F (37.9 C) (Oral)   Resp 18   Ht 5\' 5"  (1.651 m)   Wt 106.6 kg   LMP 02/06/2020 (Exact Date)   SpO2 100%   BMI 39.11 kg/m   Visual Acuity Right Eye Distance:   Left Eye Distance:   Bilateral Distance:    Right Eye Near:   Left Eye Near:    Bilateral Near:     Physical Exam Vitals and nursing note reviewed.  Constitutional:      General: She is not in acute distress.    Appearance: Normal appearance. She is not  ill-appearing.  HENT:     Head: Normocephalic and atraumatic.  Eyes:     General:        Right eye: No discharge.        Left eye: No discharge.     Conjunctiva/sclera: Conjunctivae normal.  Cardiovascular:     Rate and Rhythm: Normal rate and regular rhythm.  Pulmonary:     Effort: Pulmonary effort is normal.     Breath sounds: Normal breath sounds. No wheezing or rales.  Neurological:     Mental Status: She is alert.  Psychiatric:        Mood and Affect: Mood normal.        Behavior: Behavior normal.    UC Treatments / Results  Labs (all labs ordered are listed, but only abnormal results are displayed) Labs Reviewed  SARS CORONAVIRUS 2 (TAT 6-24 HRS) - Abnormal; Notable for the following components:      Result Value   SARS Coronavirus 2 POSITIVE (*)    All other components within normal limits    EKG   Radiology No results found.  Procedures Procedures (including critical care time)  Medications Ordered in UC Medications - No data to display  Initial Impression / Assessment and Plan / UC Course  I have reviewed the triage vital signs and the nursing notes.  Pertinent labs & imaging results that were available during my care of the patient were reviewed by me and considered in my medical decision making (see chart for details).    40 year old female presents with COVID.  Acute illness with systemic symptoms (fever).Toradol for body aches. Tessalon perles for cough. Albuterol as needed. Supportive care.   Final Clinical Impressions(s) / UC Diagnoses   Final diagnoses:  COVID     Discharge Instructions     Medication as prescribed.  Stay home.  Check my chart for COVID test results.  Take care  Dr. 04/05/2020      ED Prescriptions  Medication Sig Dispense Auth. Provider   ketorolac (TORADOL) 10 MG tablet Take 1 tablet (10 mg total) by mouth every 6 (six) hours as needed for moderate pain or severe pain. 20 tablet Izreal Kock G, DO   benzonatate  (TESSALON) 200 MG capsule Take 1 capsule (200 mg total) by mouth 3 (three) times daily as needed for cough. 30 capsule Brallan Denio G, DO   albuterol (VENTOLIN HFA) 108 (90 Base) MCG/ACT inhaler Inhale 1-2 puffs into the lungs every 6 (six) hours as needed for wheezing or shortness of breath. 18 g Tommie Sams, DO     PDMP not reviewed this encounter.   Everlene Other [place], [place] 02/19/20 [phone]

## 2020-02-20 ENCOUNTER — Other Ambulatory Visit (HOSPITAL_COMMUNITY): Payer: Self-pay | Admitting: Adult Health

## 2020-02-20 DIAGNOSIS — U071 COVID-19: Secondary | ICD-10-CM

## 2020-02-20 MED ORDER — MOLNUPIRAVIR EUA 200MG CAPSULE: 4.0000 | ORAL_CAPSULE | Freq: Two times a day (BID) | ORAL | 0 refills | Status: AC

## 2020-02-20 NOTE — Progress Notes (Signed)
Outpatient Oral COVID Treatment Note  I connected with Laura Benton on 02/20/2020/6:20 PM by telephone and verified that I am speaking with the correct person using two identifiers.  I discussed the limitations, risks, security, and privacy concerns of performing an evaluation and management service by telephone and the availability of in person appointments. I also discussed with the patient that there may be a patient responsible charge related to this service. The patient expressed understanding and agreed to proceed.  Patient location: home Provider location: home  Diagnosis: COVID-19 infection  Purpose of visit: Discussion of potential use of Molnupiravir or Paxlovid, a new treatment for mild to moderate COVID-19 viral infection in non-hospitalized patients.   Subjective: Patient is a 40 y.o. female who has been diagnosed with COVID 19 viral infection.  Their symptoms began on 02/16/2020 with cough, fever.    Past Medical History:  Diagnosis Date  . Anemia   . Anxiety    no meds  . Complication of anesthesia    hard to wake up  . Elevated blood pressure reading   . GERD (gastroesophageal reflux disease)    no meds  . Heart murmur    early 20's  . Incisional hernia, without obstruction or gangrene 03/31/2018  . Vaginal Pap smear, abnormal     No Known Allergies   Current Outpatient Medications:  .  albuterol (VENTOLIN HFA) 108 (90 Base) MCG/ACT inhaler, Inhale 1-2 puffs into the lungs every 6 (six) hours as needed for wheezing or shortness of breath., Disp: 18 g, Rfl: 0 .  benzonatate (TESSALON) 200 MG capsule, Take 1 capsule (200 mg total) by mouth 3 (three) times daily as needed for cough., Disp: 30 capsule, Rfl: 0 .  ketorolac (TORADOL) 10 MG tablet, Take 1 tablet (10 mg total) by mouth every 6 (six) hours as needed for moderate pain or severe pain., Disp: 20 tablet, Rfl: 0  Objective: Patien sounds well.  They are in no apparent distress.  Breathing is non labored.   Mood and behavior are normal.  Laboratory Data:  Recent Results (from the past 2160 hour(s))  SARS CORONAVIRUS 2 (TAT 6-24 HRS) Nasopharyngeal Nasopharyngeal Swab     Status: Abnormal   Collection Time: 02/18/20  1:24 PM   Specimen: Nasopharyngeal Swab  Result Value Ref Range   SARS Coronavirus 2 POSITIVE (A) NEGATIVE    Comment: (NOTE) SARS-CoV-2 target nucleic acids are DETECTED.  The SARS-CoV-2 RNA is generally detectable in upper and lower respiratory specimens during the acute phase of infection. Positive results are indicative of the presence of SARS-CoV-2 RNA. Clinical correlation with patient history and other diagnostic information is  necessary to determine patient infection status. Positive results do not rule out bacterial infection or co-infection with other viruses.  The expected result is Negative.  Fact Sheet for Patients: HairSlick.no  Fact Sheet for Healthcare Providers: quierodirigir.com  This test is not yet approved or cleared by the [place] FDA and  has been authorized for detection and/or diagnosis of SARS-CoV-2 by FDA under an Emergency Use Authorization (EUA). This EUA will remain  in effect (meaning this test can be used) for the duration of the COVID-19 declaration under Section 564(b)(1) of the Act, 21 U. S.C. section 360bbb-3(b)(1), unless the authorization is terminated or revoked sooner.   Performed at [facility], [address], [place], [place] [address]      Assessment: 40 y.o. female with mild/moderate COVID 19 viral infection diagnosed on 02/18/2020 at high risk for  progression to severe COVID 19.  Plan:  This patient is a 40 y.o. female that meets the following criteria for Emergency Use Authorization of: Molnupiravir  1. Age >18 yr 2. SARS-COV-2 positive test 3. Symptom onset < 5 days 4. Mild-to-moderate COVID disease with high risk for severe progression to  hospitalization or death   I have spoken and communicated the following to the patient or parent/caregiver regarding: 1. Molnupiravir is an unapproved drug that is authorized for use under an TEFL teacher.  2. There are no adequate, approved, available products for the treatment of COVID-19 in adults who have mild-to-moderate COVID-19 and are at high risk for progressing to severe COVID-19, including hospitalization or death. 3. Other therapeutics are currently authorized. For additional information on all products authorized for treatment or prevention of COVID-19, please see https://www.graham-miller.com/.  4. There are benefits and risks of taking this treatment as outlined in the "Fact Sheet for Patients and Caregivers."  5. "Fact Sheet for Patients and Caregivers" was reviewed with patient. A hard copy will be provided to patient from pharmacy prior to the patient receiving treatment. 6. Patients should continue to self-isolate and use infection control measures (e.g., wear mask, isolate, social distance, avoid sharing personal items, clean and disinfect "high touch" surfaces, and frequent handwashing) according to CDC guidelines.  7. The patient or parent/caregiver has the option to accept or refuse treatment. 8. [employer] has established a pregnancy surveillance program. 9. Females of childbearing potential should use a reliable method of contraception correctly and consistently, as applicable, for the duration of treatment and for 4 days after the last dose of Molnupiravir. 10. Males of reproductive potential who are sexually active with females of childbearing potential should use a reliable method of contraception correctly and consistently during treatment and for at least 3 months after the last dose. 11. Pregnancy status and risk was assessed. Patient verbalized  understanding of precautions.  LMP 02/11/2020.   After reviewing above information with the patient, the patient agrees to receive molnupiravir.  Follow up instructions:    . Take prescription BID x 5 days as directed . Reach out to pharmacist for counseling on medication if desired . For concerns regarding further COVID symptoms please follow up with your PCP or urgent care . For urgent or life-threatening issues, seek care at your local emergency department  The patient was provided an opportunity to ask questions, and all were answered. The patient agreed with the plan and demonstrated an understanding of the instructions.   Script sent to [facility] and opted to [employer] via mail order (verified patients address for delivery).  The patient was advised to call their PCP or seek an in-person evaluation if the symptoms worsen or if the condition fails to improve as anticipated.   I provided 15 minutes of non face-to-face telephone visit time during this encounter, and > 50% was spent counseling as documented under my assessment & plan.  Noreene Filbert, NP 02/20/2020 Marcello Fennel PM

## 2020-02-21 ENCOUNTER — Telehealth (HOSPITAL_COMMUNITY): Payer: Self-pay | Admitting: Pharmacist

## 2020-02-21 ENCOUNTER — Other Ambulatory Visit (HOSPITAL_COMMUNITY): Payer: Self-pay | Admitting: Adult Health

## 2020-02-21 MED FILL — MOLNUPIRAVIR 200 MG CAPS: 200 | 5 days supply | Qty: 40 | Fill #0

## 2020-02-21 NOTE — Telephone Encounter (Signed)
Patient was prescribed oral covid treatment MOLNUPIRAVIR and treatment note was reviewed. Medication has been received by {[facility]:[id number]] and reviewed for appropriateness.  Drug Interactions or Dosage Adjustments Noted: NONE  Delivery Method: UPS NEXT DAY  Patient contacted for counseling on TELEPHONE  and verbalized understanding.   Delivery or Pick-Up Date: 02/21/20   Laura Benton 02/21/2020, 11:00 AM [facility] Outpatient Pharmacist Phone# [phone]

## 2020-04-17 ENCOUNTER — Other Ambulatory Visit (HOSPITAL_BASED_OUTPATIENT_CLINIC_OR_DEPARTMENT_OTHER): Payer: Self-pay

## 2020-05-25 DIAGNOSIS — S0990XA Unspecified injury of head, initial encounter: Secondary | ICD-10-CM

## 2020-05-25 HISTORY — DX: Unspecified injury of head, initial encounter: S09.90XA

## 2020-06-22 ENCOUNTER — Other Ambulatory Visit: Payer: Self-pay

## 2020-06-22 ENCOUNTER — Emergency Department
Admission: EM | Admit: 2020-06-22 | Discharge: 2020-06-22 | Disposition: A | Discharge status: Home / Self Care | Payer: Medicaid Other | Attending: Emergency Medicine | Admitting: Emergency Medicine

## 2020-06-22 ENCOUNTER — Emergency Department: Payer: Medicaid Other

## 2020-06-22 DIAGNOSIS — Z23 Encounter for immunization: Secondary | ICD-10-CM | POA: Insufficient documentation

## 2020-06-22 DIAGNOSIS — F17290 Nicotine dependence, other tobacco product, uncomplicated: Secondary | ICD-10-CM | POA: Diagnosis not present

## 2020-06-22 DIAGNOSIS — S0101XA Laceration without foreign body of scalp, initial encounter: Secondary | ICD-10-CM | POA: Diagnosis not present

## 2020-06-22 DIAGNOSIS — W228XXA Striking against or struck by other objects, initial encounter: Secondary | ICD-10-CM | POA: Diagnosis not present

## 2020-06-22 DIAGNOSIS — S0990XA Unspecified injury of head, initial encounter: Secondary | ICD-10-CM

## 2020-06-22 MED ORDER — IBUPROFEN 600 MG PO TABS
600.0000 mg | ORAL_TABLET | Freq: Once | ORAL | Status: AC
  Administered 2020-06-22: 600 mg via ORAL
  Filled 2020-06-22: qty 1

## 2020-06-22 MED ORDER — TETANUS-DIPHTH-ACELL PERTUSSIS 5-2.5-18.5 LF-MCG/0.5 IM SUSY
0.5000 mL | PREFILLED_SYRINGE | Freq: Once | INTRAMUSCULAR | Status: AC
  Administered 2020-06-22: 0.5 mL via INTRAMUSCULAR
  Filled 2020-06-22: qty 0.5
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
## 2020-06-22 NOTE — Discharge Instructions (Signed)
You received 1 staple that needs to be removed in 7 days.  You can wash your head with soap and water normally.

## 2020-06-22 NOTE — ED Notes (Addendum)
Rigid c collar applied, cms intact to extremities. Additional pressure dressing applied, charge rn notified of need for bed.
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
## 2020-06-22 NOTE — ED Notes (Signed)
Pt given a warm blanket 
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
## 2020-07-10 ENCOUNTER — Ambulatory Visit
Admission: RE | Admit: 2020-07-10 | Discharge: 2020-07-10 | Disposition: A | Discharge status: Home / Self Care | Payer: Medicaid Other | Source: Ambulatory Visit | Attending: Surgery | Admitting: Surgery

## 2020-07-10 ENCOUNTER — Encounter: Payer: Self-pay | Admitting: Surgery

## 2020-07-10 ENCOUNTER — Other Ambulatory Visit: Payer: Self-pay

## 2020-07-10 ENCOUNTER — Ambulatory Visit (INDEPENDENT_AMBULATORY_CARE_PROVIDER_SITE_OTHER): Payer: Medicaid Other | Admitting: Surgery

## 2020-07-10 VITALS — BP 137/87 | HR 91 | Temp 99.6°F | Ht 65.0 in | Wt 237.4 lb

## 2020-07-10 DIAGNOSIS — R1031 Right lower quadrant pain: Secondary | ICD-10-CM | POA: Insufficient documentation

## 2020-07-10 NOTE — Patient Instructions (Addendum)
CT scheduled today. Please go to the [facility] entrance of [facility]. Dr.Rodenberg will call you with the results.

## 2020-07-10 NOTE — H&P (View-Only) (Signed)
Patient ID: Laura Benton, female   DOB: 16-Feb-1980, 40 y.o.   MRN: [id number]  Chief Complaint: Increasing abdominal pain.  History of Present Illness She returns today in follow-up of her right lower quadrant abdominal mass, increasing in size and discomfort.  She did not follow through with her previous visit and the plan for repeat CT imaging.  She reports no history of nausea, vomiting, diarrhea or constipation.  Indicates primarily increasing size and discomfort.  Previously last November: Laura Benton is a 40 y.o. female with complaint of increasing size, and pain at previously prepared lower abdominal ventral hernia repair. Over the last 6 days, her symptoms have become worse. She reported that things had improved after her last visit, CT scan reassured me that her hernia had not come back and that the retained hernia sac/post hernia repair seroma was the cause of her symptoms at that time. However now she has had increased pain over the last week or so, without precipitating factors of any. Again concerned about potential recurrence.  Previously last June: Laura Benton is a 40 y.o. female with 3-day history of abdominal pain, pain is localized to area of prior incisional hernia repair.  Today is her 1 year anniversary of this laparoscopic repair.  She noted progressive bulging in her right lower quadrant abdomen over the last 3 days with increased associated pain.  Coughing and sneezing exacerbates the pain.  It is tender to her 24-year old daughters touch.  She denies nausea, vomiting, fevers, chills or changes in bowel function.  She denies any problems utilizing the bathroom.  Prior mesh repair completed laparoscopically here at [facility].   Past Medical History Past Medical History:  Diagnosis Date   Anemia    Anxiety    no meds   Complication of anesthesia    hard to wake up   Elevated blood pressure reading    GERD (gastroesophageal reflux disease)    no  meds   Heart murmur    early 20's   Incisional hernia, without obstruction or gangrene 03/31/2018   Vaginal Pap smear, abnormal       Past Surgical History:  Procedure Laterality Date   CESAREAN SECTION     x2   HERNIA REPAIR     INCISIONAL HERNIA REPAIR N/A 07/12/2018   Procedure: LAPAROSCOPIC INCISIONAL ABDOMINAL WALL HERNIA REPAIR;  Surgeon: Ancil Linsey, MD;  Location: [facility];  Service: General;  Laterality: N/A;   right shoulder surgery     TONSILLECTOMY     UTERINE FIBROID SURGERY      No Known Allergies  Current Outpatient Medications  Medication Sig Dispense Refill   ketorolac (TORADOL) 10 MG tablet Take 1 tablet (10 mg total) by mouth every 6 (six) hours as needed for moderate pain or severe pain. 20 tablet 0   No current facility-administered medications for this visit.    Family History Family History  Problem Relation Age of Onset   Throat cancer Father    Anemia Sister    Pulmonary disease Son       Social History Social History   Tobacco Use   Smoking status: Some Days    Pack years: 0.00    Types: Cigars   Smokeless tobacco: Never   Tobacco comments:    black and milds  Vaping Use   Vaping Use: Never used  Substance Use Topics   Alcohol use: Yes    Comment: beer/liqour qd   Drug use: Never  Review of Systems  Constitutional: Negative.   HENT: Negative.    Eyes: Negative.   Respiratory: Negative.    Cardiovascular: Negative.   Gastrointestinal:  Positive for abdominal pain. Negative for blood in stool, diarrhea, heartburn, melena, nausea and vomiting.  Genitourinary: Negative.   Skin: Negative.   Neurological: Negative.   Psychiatric/Behavioral: Negative.       Physical Exam Blood pressure 137/87, pulse 91, temperature 99.6 F (37.6 C), temperature source Oral, height 5\' 5"  (1.651 m), weight 237 lb 6.4 oz (107.7 kg), SpO2 96 %, currently breastfeeding. Last Weight  Most recent update: 07/10/2020 12:03 PM    Weight   107.7 kg (237 lb 6.4 oz)             CONSTITUTIONAL: Well developed, and nourished, appropriately responsive and aware without distress.  Obese.   EYES: Sclera non-icteric.   EARS, NOSE, MOUTH AND THROAT: Mask worn.     Hearing is intact to voice.  NECK: Trachea is midline, and there is no jugular venous distension.  LYMPH NODES:  Lymph nodes in the neck are not enlarged. RESPIRATORY:  Lungs are clear, and breath sounds are equal bilaterally. Normal respiratory effort without pathologic use of accessory muscles. CARDIOVASCULAR: Heart is regular in rate and rhythm. GI: Well established previously appreciated right lower quadrant abdominal wall mass is still present.  It is a bit even more tense than I remember it, but it does not diminish, and it does not change with Valsalva.  We reviewed previous CT imaging, confirming that this is consistent with a postoperative seroma.  Otherwise the abdomen is soft, nontender, and nondistended. There were no other palpable masses. I did not appreciate hepatosplenomegaly. There were normal bowel sounds. MUSCULOSKELETAL:  Symmetrical muscle tone appreciated in all four extremities.    SKIN: Skin turgor is normal. No pathologic skin lesions appreciated.  NEUROLOGIC:  Motor and sensation appear grossly normal.  Cranial nerves are grossly without defect. PSYCH:  Alert and oriented to person, place and time. Affect is appropriate for situation.  Data Reviewed I have personally reviewed what is currently available of the patient's imaging, recent labs and medical records.   Labs:  CBC Latest Ref Rng & Units 07/06/2018 11/04/2017 04/07/2015  WBC 4.0 - 10.5 K/uL 6.6 5.1 5.8  Hemoglobin 12.0 - 15.0 g/dL 04/09/2015 00.9 38.1  Hematocrit 36.0 - 46.0 % 41.1 42.3 40.2  Platelets 150 - 400 K/uL 227 216 253   CMP Latest Ref Rng & Units 07/06/2018 11/04/2017 04/07/2015  Glucose 70 - 99 mg/dL 99 04/09/2015) 937(J)  BUN 6 - 20 mg/dL 11 10 12   Creatinine 0.44 - 1.00 mg/dL 696(V  ) 8.93)  Sodium 135 - 145 mmol/L 135 142 136  Potassium 3.5 - 5.1 mmol/L 4.1 4.7 4.0  Chloride 98 - 111 mmol/L 104 103 103  CO2 22 - 32 mmol/L 21(L) 29 26  Calcium 8.9 - 10.3 mg/dL 8.10(F) 9.2 9.3  Total Protein 6.5 - 8.1 g/dL 7.2 7.6 7.1  Total Bilirubin 0.3 - 1.2 mg/dL 0.3 0.7 0.6  Alkaline Phos 38 - 126 U/L 71 66 46  AST 15 - 41 U/L 40 19 35  ALT 0 - 44 U/L 33 15 24      Imaging: Radiology review: Reviewed the images together with the patient from July 2021, comparing them with those from previous, explaining the presence of the old hernia sac becoming the current seroma cavity.   Assessment    Persistent postoperative seroma following laparoscopic incisional hernia  repair with mesh. Patient Active Problem List   Diagnosis Date Noted   Recurrent incisional hernia 07/12/2019   Tobacco abuse 07/12/2019   Morbid obesity (HCC) 07/12/2019   Abdominal pain 06/10/2017    Plan    We discussed the possibility/risks and benefits of percutaneous drainage.  Risks of infection weighed against those of recurrent seroma.  Radiology, and I, want another set of images prior to considering any intervention.  We discussed intervention in the OR with drain placement, options including interventional radiology consult with a percutaneous drain placement. I am still not suspicious that her hernia has recurred but still agree with getting additional imaging.  We could refer her for drain placement should recurrent hernia again be confirmed.  Or may proceed with operative intervention on her own.  Face-to-face time spent with the patient and accompanying care providers(if present) was 20 minutes, with more than 50% of the time spent counseling, educating, and coordinating care of the patient.      Campbell Lerner M.D., FACS 07/10/2020, 12:17 PM

## 2020-07-10 NOTE — Progress Notes (Signed)
Patient ID: Laura Benton, female   DOB: 16-Feb-1980, 40 y.o.   MRN: [id number]  Chief Complaint: Increasing abdominal pain.  History of Present Illness She returns today in follow-up of her right lower quadrant abdominal mass, increasing in size and discomfort.  She did not follow through with her previous visit and the plan for repeat CT imaging.  She reports no history of nausea, vomiting, diarrhea or constipation.  Indicates primarily increasing size and discomfort.  Previously last November: Laura Benton is a 40 y.o. female with complaint of increasing size, and pain at previously prepared lower abdominal ventral hernia repair. Over the last 6 days, her symptoms have become worse. She reported that things had improved after her last visit, CT scan reassured me that her hernia had not come back and that the retained hernia sac/post hernia repair seroma was the cause of her symptoms at that time. However now she has had increased pain over the last week or so, without precipitating factors of any. Again concerned about potential recurrence.  Previously last June: Laura Benton is a 40 y.o. female with 3-day history of abdominal pain, pain is localized to area of prior incisional hernia repair.  Today is her 1 year anniversary of this laparoscopic repair.  She noted progressive bulging in her right lower quadrant abdomen over the last 3 days with increased associated pain.  Coughing and sneezing exacerbates the pain.  It is tender to her 24-year old daughters touch.  She denies nausea, vomiting, fevers, chills or changes in bowel function.  She denies any problems utilizing the bathroom.  Prior mesh repair completed laparoscopically here at [facility].   Past Medical History Past Medical History:  Diagnosis Date   Anemia    Anxiety    no meds   Complication of anesthesia    hard to wake up   Elevated blood pressure reading    GERD (gastroesophageal reflux disease)    no  meds   Heart murmur    early 20's   Incisional hernia, without obstruction or gangrene 03/31/2018   Vaginal Pap smear, abnormal       Past Surgical History:  Procedure Laterality Date   CESAREAN SECTION     x2   HERNIA REPAIR     INCISIONAL HERNIA REPAIR N/A 07/12/2018   Procedure: LAPAROSCOPIC INCISIONAL ABDOMINAL WALL HERNIA REPAIR;  Surgeon: Ancil Linsey, MD;  Location: [facility];  Service: General;  Laterality: N/A;   right shoulder surgery     TONSILLECTOMY     UTERINE FIBROID SURGERY      No Known Allergies  Current Outpatient Medications  Medication Sig Dispense Refill   ketorolac (TORADOL) 10 MG tablet Take 1 tablet (10 mg total) by mouth every 6 (six) hours as needed for moderate pain or severe pain. 20 tablet 0   No current facility-administered medications for this visit.    Family History Family History  Problem Relation Age of Onset   Throat cancer Father    Anemia Sister    Pulmonary disease Son       Social History Social History   Tobacco Use   Smoking status: Some Days    Pack years: 0.00    Types: Cigars   Smokeless tobacco: Never   Tobacco comments:    black and milds  Vaping Use   Vaping Use: Never used  Substance Use Topics   Alcohol use: Yes    Comment: beer/liqour qd   Drug use: Never  Review of Systems  Constitutional: Negative.   HENT: Negative.    Eyes: Negative.   Respiratory: Negative.    Cardiovascular: Negative.   Gastrointestinal:  Positive for abdominal pain. Negative for blood in stool, diarrhea, heartburn, melena, nausea and vomiting.  Genitourinary: Negative.   Skin: Negative.   Neurological: Negative.   Psychiatric/Behavioral: Negative.       Physical Exam Blood pressure 137/87, pulse 91, temperature 99.6 F (37.6 C), temperature source Oral, height 5\' 5"  (1.651 m), weight 237 lb 6.4 oz (107.7 kg), SpO2 96 %, currently breastfeeding. Last Weight  Most recent update: 07/10/2020 12:03 PM    Weight   107.7 kg (237 lb 6.4 oz)             CONSTITUTIONAL: Well developed, and nourished, appropriately responsive and aware without distress.  Obese.   EYES: Sclera non-icteric.   EARS, NOSE, MOUTH AND THROAT: Mask worn.     Hearing is intact to voice.  NECK: Trachea is midline, and there is no jugular venous distension.  LYMPH NODES:  Lymph nodes in the neck are not enlarged. RESPIRATORY:  Lungs are clear, and breath sounds are equal bilaterally. Normal respiratory effort without pathologic use of accessory muscles. CARDIOVASCULAR: Heart is regular in rate and rhythm. GI: Well established previously appreciated right lower quadrant abdominal wall mass is still present.  It is a bit even more tense than I remember it, but it does not diminish, and it does not change with Valsalva.  We reviewed previous CT imaging, confirming that this is consistent with a postoperative seroma.  Otherwise the abdomen is soft, nontender, and nondistended. There were no other palpable masses. I did not appreciate hepatosplenomegaly. There were normal bowel sounds. MUSCULOSKELETAL:  Symmetrical muscle tone appreciated in all four extremities.    SKIN: Skin turgor is normal. No pathologic skin lesions appreciated.  NEUROLOGIC:  Motor and sensation appear grossly normal.  Cranial nerves are grossly without defect. PSYCH:  Alert and oriented to person, place and time. Affect is appropriate for situation.  Data Reviewed I have personally reviewed what is currently available of the patient's imaging, recent labs and medical records.   Labs:  CBC Latest Ref Rng & Units 07/06/2018 11/04/2017 04/07/2015  WBC 4.0 - 10.5 K/uL 6.6 5.1 5.8  Hemoglobin 12.0 - 15.0 g/dL 04/09/2015 00.9 38.1  Hematocrit 36.0 - 46.0 % 41.1 42.3 40.2  Platelets 150 - 400 K/uL 227 216 253   CMP Latest Ref Rng & Units 07/06/2018 11/04/2017 04/07/2015  Glucose 70 - 99 mg/dL 99 04/09/2015) 937(J)  BUN 6 - 20 mg/dL 11 10 12   Creatinine 0.44 - 1.00 mg/dL 696(V  ) 8.93)  Sodium 135 - 145 mmol/L 135 142 136  Potassium 3.5 - 5.1 mmol/L 4.1 4.7 4.0  Chloride 98 - 111 mmol/L 104 103 103  CO2 22 - 32 mmol/L 21(L) 29 26  Calcium 8.9 - 10.3 mg/dL 8.10(F) 9.2 9.3  Total Protein 6.5 - 8.1 g/dL 7.2 7.6 7.1  Total Bilirubin 0.3 - 1.2 mg/dL 0.3 0.7 0.6  Alkaline Phos 38 - 126 U/L 71 66 46  AST 15 - 41 U/L 40 19 35  ALT 0 - 44 U/L 33 15 24      Imaging: Radiology review: Reviewed the images together with the patient from July 2021, comparing them with those from previous, explaining the presence of the old hernia sac becoming the current seroma cavity.   Assessment    Persistent postoperative seroma following laparoscopic incisional hernia  repair with mesh. Patient Active Problem List   Diagnosis Date Noted   Recurrent incisional hernia 07/12/2019   Tobacco abuse 07/12/2019   Morbid obesity (HCC) 07/12/2019   Abdominal pain 06/10/2017    Plan    We discussed the possibility/risks and benefits of percutaneous drainage.  Risks of infection weighed against those of recurrent seroma.  Radiology, and I, want another set of images prior to considering any intervention.  We discussed intervention in the OR with drain placement, options including interventional radiology consult with a percutaneous drain placement. I am still not suspicious that her hernia has recurred but still agree with getting additional imaging.  We could refer her for drain placement should recurrent hernia again be confirmed.  Or may proceed with operative intervention on her own.  Face-to-face time spent with the patient and accompanying care providers(if present) was 20 minutes, with more than 50% of the time spent counseling, educating, and coordinating care of the patient.      Campbell Lerner M.D., FACS 07/10/2020, 12:17 PM

## 2020-07-11 ENCOUNTER — Telehealth: Payer: Self-pay | Admitting: Surgery

## 2020-07-11 NOTE — Telephone Encounter (Signed)
Outgoing call is made, left message for patient to call.  Please inform patient of Pre-Admission date/time, COVID Testing date and Surgery date.  Surgery Date: 07/16/20 Preadmission Testing Date: 07/14/20 (phone 1p-5p) Covid Testing Date: Not needed.     Also patient to call at [phone], between 1-3:00pm the day before surgery, to find out what time to arrive for surgery.

## 2020-07-14 ENCOUNTER — Other Ambulatory Visit: Payer: Self-pay

## 2020-07-14 ENCOUNTER — Encounter: Payer: Self-pay | Admitting: Surgery

## 2020-07-14 ENCOUNTER — Telehealth: Payer: Self-pay | Admitting: Surgery

## 2020-07-14 ENCOUNTER — Other Ambulatory Visit
Admission: RE | Admit: 2020-07-14 | Discharge: 2020-07-14 | Disposition: A | Discharge status: Home / Self Care | Payer: Medicaid Other | Source: Ambulatory Visit | Attending: Surgery | Admitting: Surgery

## 2020-07-14 ENCOUNTER — Ambulatory Visit: Payer: Self-pay | Admitting: Surgery

## 2020-07-14 DIAGNOSIS — R1031 Right lower quadrant pain: Secondary | ICD-10-CM

## 2020-07-14 HISTORY — DX: Failed or difficult intubation, initial encounter: T88.4XXA

## 2020-07-14 NOTE — Pre-Procedure Instructions (Signed)
Spoke with patient via phone to review preop information.  She is concerned about her history of difficulty with waking and would like to have minimal anesthesia.  Also, would like to return to work next day.  Discouraged this d/t anesthesia as well as needing the MD okay.  Still has staple in her scalp from recent head trauma.  Also, she is not sure if she can find someone to drive her to and from hospital or to find someone to stay with for 24 hours.  She is working on it.

## 2020-07-14 NOTE — Telephone Encounter (Signed)
Multiple attempts have been made to contact patient regarding her surgery with Dr. Claudine Mouton for 07/16/20.  Patient has yet to return any calls made to her.

## 2020-07-14 NOTE — Telephone Encounter (Signed)
Patient calls back, she is now aware of her surgical procedure for 07/16/20 and verbalized understanding although she did want Dr. Claudine Mouton to call her prior to the surgery as she has some additional concerns and questions.

## 2020-07-14 NOTE — Patient Instructions (Addendum)
INSTRUCTIONS FOR SURGERY     Your surgery is scheduled for:  Wednesday, June 22ND     To find out your arrival time for the day of surgery,          please call [phone] between 1 pm and 3 pm on :  June 21ST     When you arrive for surgery, report to the REGISTRATION DESK ON THE FIRST FLOOR OF THE MEDICAL MALL. ONCE THEY HAVE COMPLETED THEIR PROCESS, PROCEED TO THE SECOND FLOOR AND SIGN IN AT THE SURGERY DESK.  PLEASE HAVE A MASK.    REMEMBER: Instructions that are not followed completely may result in serious medical risk,  up to and including death, or upon the discretion of your surgeon and anesthesiologist,            your surgery may need to be rescheduled.  __X__ 1. Do not eat food after midnight the night before your procedure.                    No gum, candy, lozenger, tic tacs, tums or hard candies.                  ABSOLUTELY NOTHING SOLID IN YOUR MOUTH AFTER MIDNIGHT                    You may drink unlimited clear liquids up to 2 hours before you are scheduled to arrive for surgery.                   Do not drink anything within those 2 hours unless you need to take medicine, then take the                   smallest amount you need.  Clear liquids include:  water, apple juice without pulp,                   any flavor Gatorade, Black coffee, black tea.  Sugar may be added but no dairy/ honey /lemon.                        Broth and jello is not considered a clear liquid.  __x__  2. On the morning of surgery, please brush your teeth with toothpaste and water. You may rinse with                  mouthwash if you wish but DO NOT SWALLOW TOOTHPASTE OR MOUTHWASH  __X___3. NO alcohol for 24 hours before or after surgery.  __x___ 4.  Do NOT smoke or use e-cigarettes for 24 HOURS PRIOR TO SURGERY.                      DO NOT  Use any chewable tobacco products for at least 6 hours prior to surgery.  __x___ 5. If you  start any new medication after this appointment and prior to surgery, please                   Bring it with you on the day of surgery.  ___x__ 6. Notify your doctor if there is any change in your medical condition, such as fever,                   infection, vomitting, diarrhea or any open sores.  __x___ 7.  USE ANTIBACTERIAL  SOAP as instructed, the night before surgery and the day of surgery.                   Once you have washed with this soap, do NOT use any of the following: Powders, perfumes                    or lotions. Please do not wear make up, hairpins, clips or nail polish. You MAY wear deodorant.                                                 Women need to shave 48 hours prior to surgery.                   DO NOT wear ANY jewelry on the day of surgery. If there are rings that are too tight to                    remove easily, please address this prior to the surgery day. Piercings need to be removed.                                                                     NO METAL ON YOUR BODY.                    Do NOT bring any valuables.  If you came to Pre-Admit testing then you will not need license,                     insurance card or credit card.  If you will be staying overnight, please either leave your things in                     the car or have your family be responsible for these items.                     [facility] [place] [facility] IS NOT RESPONSIBLE FOR BELONGINGS OR VALUABLES.  ___X__ 8. DO NOT wear contact lenses on surgery day.  You may not have dentures,                     Hearing aides, contacts or glasses in the operating room. These items can be                    Placed in the Recovery Room to receive immediately after surgery.  __x___ 9. IF YOU ARE SCHEDULED TO GO HOME ON THE SAME DAY, YOU MUST                   Have someone to drive you home and to stay with you  for the first 24 hours.  Have an arrangement prior to arriving on surgery  day.  ___x__ 10. Take the following medications on the morning of surgery with a sip of water:                              1.  NOTHING                     2.   __X__  12. STOP ALL ASPIRIN PRODUCTS IMMEDIATELY  07/14/20                       THIS INCLUDES BC POWDERS / GOODIES POWDER  __x___ 13. STOP Anti-inflammatories IMMEDIATELY 07/14/20                      This includes IBUPROFEN / MOTRIN / ADVIL / ALEVE/ NAPROXYN                    YOU MAY TAKE TYLENOL ANY TIME PRIOR TO SURGERY.  _____ 14.  Stop supplements until after surgery.             __X____18.  Wear clean and comfortable clothing to the hospital.              HAVE SOMETHING LOOSE AROUND YOUR BELLY.  BRING PHONE NUMBERS FOR YOUR CONTACTS.  **MUST MUST HAVE SOMEONE TO STAY WITH YOU FOR 24 HOURS AFTER SURGERY**

## 2020-07-15 MED ORDER — FAMOTIDINE 20 MG PO TABS: 20.0000 mg | ORAL_TABLET | Freq: Once | ORAL | Status: AC

## 2020-07-15 MED ORDER — ACETAMINOPHEN 500 MG PO TABS: 1000.0000 mg | ORAL_TABLET | ORAL | Status: AC

## 2020-07-15 MED ORDER — CHLORHEXIDINE GLUCONATE 0.12 % MT SOLN: 15.0000 mL | Freq: Once | OROMUCOSAL | Status: AC

## 2020-07-15 MED ORDER — LACTATED RINGERS IV SOLN: INTRAVENOUS | Status: DC

## 2020-07-15 MED ORDER — CELECOXIB 200 MG PO CAPS: 200.0000 mg | ORAL_CAPSULE | ORAL | Status: AC

## 2020-07-15 MED ORDER — CEFAZOLIN SODIUM-DEXTROSE 2-4 GM/100ML-% IV SOLN
2.0000 g | INTRAVENOUS | Status: AC
  Administered 2020-07-16: 2 g via INTRAVENOUS

## 2020-07-15 MED ORDER — CHLORHEXIDINE GLUCONATE CLOTH 2 % EX PADS
6.0000 | MEDICATED_PAD | Freq: Once | CUTANEOUS | Status: DC
Start: 2020-07-15 — End: 2020-07-16

## 2020-07-15 MED ORDER — ORAL CARE MOUTH RINSE: 15.0000 mL | Freq: Once | OROMUCOSAL | Status: AC

## 2020-07-15 MED ORDER — GABAPENTIN 300 MG PO CAPS: 300.0000 mg | ORAL_CAPSULE | ORAL | Status: AC

## 2020-07-16 ENCOUNTER — Ambulatory Visit: Payer: Medicaid Other | Admitting: Urgent Care

## 2020-07-16 ENCOUNTER — Ambulatory Visit
Admission: RE | Admit: 2020-07-16 | Discharge: 2020-07-16 | Disposition: A | Discharge status: Home / Self Care | Payer: Medicaid Other | Attending: Surgery | Admitting: Surgery

## 2020-07-16 ENCOUNTER — Encounter
Admission: RE | Disposition: A | Discharge status: Home / Self Care | Payer: Self-pay | Source: Home / Self Care | Attending: Surgery

## 2020-07-16 DIAGNOSIS — Z79899 Other long term (current) drug therapy: Secondary | ICD-10-CM | POA: Insufficient documentation

## 2020-07-16 DIAGNOSIS — L7634 Postprocedural seroma of skin and subcutaneous tissue following other procedure: Secondary | ICD-10-CM

## 2020-07-16 DIAGNOSIS — Y838 Other surgical procedures as the cause of abnormal reaction of the patient, or of later complication, without mention of misadventure at the time of the procedure: Secondary | ICD-10-CM | POA: Insufficient documentation

## 2020-07-16 DIAGNOSIS — Z800 Family history of malignant neoplasm of digestive organs: Secondary | ICD-10-CM | POA: Diagnosis not present

## 2020-07-16 DIAGNOSIS — Z836 Family history of other diseases of the respiratory system: Secondary | ICD-10-CM | POA: Insufficient documentation

## 2020-07-16 DIAGNOSIS — F17290 Nicotine dependence, other tobacco product, uncomplicated: Secondary | ICD-10-CM | POA: Diagnosis not present

## 2020-07-16 DIAGNOSIS — Z791 Long term (current) use of non-steroidal anti-inflammatories (NSAID): Secondary | ICD-10-CM | POA: Diagnosis not present

## 2020-07-16 DIAGNOSIS — R1031 Right lower quadrant pain: Secondary | ICD-10-CM

## 2020-07-16 DIAGNOSIS — K91872 Postprocedural seroma of a digestive system organ or structure following a digestive system procedure: Secondary | ICD-10-CM | POA: Diagnosis present

## 2020-07-16 HISTORY — PX: INCISION AND DRAINAGE ABSCESS: SHX5864

## 2020-07-16 LAB — COMPREHENSIVE METABOLIC PANEL
ALT: 13 U/L (ref 0–44)
AST: 19 U/L (ref 15–41)
Albumin: 3.7 g/dL (ref 3.5–5.0)
Alkaline Phosphatase: 59 U/L (ref 38–126)
Anion gap: 6 (ref 5–15)
BUN: 8 mg/dL (ref 6–20)
CO2: 26 mmol/L (ref 22–32)
Calcium: 8.9 mg/dL (ref 8.9–10.3)
Chloride: 107 mmol/L (ref 98–111)
Creatinine, Ser: 0.99 mg/dL (ref 0.44–1.00)
GFR, Estimated: >60 mL/min (ref >60)
Glucose, Bld: 113 mg/dL — ABNORMAL HIGH (ref 70–99)
Potassium: 4 mmol/L (ref 3.5–5.1)
Sodium: 139 mmol/L (ref 135–145)
Total Bilirubin: 0.9 mg/dL (ref 0.3–1.2)
Total Protein: 7.6 g/dL (ref 6.5–8.1)

## 2020-07-16 LAB — CBC WITH DIFFERENTIAL/PLATELET
Abs Immature Granulocytes: 0.03 10*3/uL (ref 0.00–0.07)
Basophils Absolute: 0 10*3/uL (ref 0.0–0.1)
Basophils Relative: 1 %
Eosinophils Absolute: 0.1 10*3/uL (ref 0.0–0.5)
Eosinophils Relative: 1 %
HCT: 40.9 % (ref 36.0–46.0)
Hemoglobin: 13.6 g/dL (ref 12.0–15.0)
Immature Granulocytes: 1 %
Lymphocytes Relative: 26 %
Lymphs Abs: 1.7 10*3/uL (ref 0.7–4.0)
MCH: 29 pg (ref 26.0–34.0)
MCHC: 33.3 g/dL (ref 30.0–36.0)
MCV: 87.2 fL (ref 80.0–100.0)
Monocytes Absolute: 0.5 10*3/uL (ref 0.1–1.0)
Monocytes Relative: 8 %
Neutro Abs: 4.2 10*3/uL (ref 1.7–7.7)
Neutrophils Relative %: 63 %
Platelets: 323 10*3/uL (ref 150–400)
RBC: 4.69 MIL/uL (ref 3.87–5.11)
RDW: 13.4 % (ref 11.5–15.5)
WBC: 6.4 10*3/uL (ref 4.0–10.5)
nRBC: 0 % (ref 0.0–0.2)

## 2020-07-16 LAB — POCT PREGNANCY, URINE: Preg Test, Ur: NEGATIVE

## 2020-07-16 SURGERY — INCISION AND DRAINAGE, ABSCESS
Anesthesia: General

## 2020-07-16 MED ORDER — DEXAMETHASONE SODIUM PHOSPHATE 10 MG/ML IJ SOLN
INTRAMUSCULAR | Status: DC | PRN
  Administered 2020-07-16: 10 mg via INTRAVENOUS

## 2020-07-16 MED ORDER — FENTANYL CITRATE (PF) 100 MCG/2ML IJ SOLN
INTRAMUSCULAR | Status: DC | PRN
  Administered 2020-07-16: 50 ug via INTRAVENOUS

## 2020-07-16 MED ORDER — BUPIVACAINE-EPINEPHRINE 0.25% -1:200000 IJ SOLN
INTRAMUSCULAR | Status: DC | PRN
  Administered 2020-07-16: 10 mL

## 2020-07-16 MED ORDER — BUPIVACAINE-EPINEPHRINE (PF) 0.25% -1:200000 IJ SOLN
INTRAMUSCULAR | Status: AC
  Filled 2020-07-16: qty 30

## 2020-07-16 MED ORDER — PROPOFOL 10 MG/ML IV BOLUS
INTRAVENOUS | Status: AC
  Filled 2020-07-16: qty 20

## 2020-07-16 MED ORDER — MIDAZOLAM HCL 2 MG/2ML IJ SOLN
INTRAMUSCULAR | Status: DC | PRN
  Administered 2020-07-16: 2 mg via INTRAVENOUS

## 2020-07-16 MED ORDER — MEPERIDINE HCL 25 MG/ML IJ SOLN: 6.2500 mg | INTRAMUSCULAR | Status: DC | PRN

## 2020-07-16 MED ORDER — GABAPENTIN 300 MG PO CAPS
ORAL_CAPSULE | ORAL | Status: AC
  Administered 2020-07-16: 300 mg via ORAL
  Filled 2020-07-16: qty 1

## 2020-07-16 MED ORDER — LIDOCAINE HCL (CARDIAC) PF 100 MG/5ML IV SOSY
PREFILLED_SYRINGE | INTRAVENOUS | Status: DC | PRN
  Administered 2020-07-16: 100 mg via INTRAVENOUS

## 2020-07-16 MED ORDER — FENTANYL CITRATE (PF) 100 MCG/2ML IJ SOLN
INTRAMUSCULAR | Status: AC
  Filled 2020-07-16: qty 2

## 2020-07-16 MED ORDER — ONDANSETRON HCL 4 MG/2ML IJ SOLN: 4.0000 mg | Freq: Once | INTRAMUSCULAR | Status: DC | PRN

## 2020-07-16 MED ORDER — MIDAZOLAM HCL 2 MG/2ML IJ SOLN
INTRAMUSCULAR | Status: AC
  Filled 2020-07-16: qty 2

## 2020-07-16 MED ORDER — ACETAMINOPHEN 500 MG PO TABS
ORAL_TABLET | ORAL | Status: AC
  Administered 2020-07-16: 1000 mg via ORAL
  Filled 2020-07-16: qty 2

## 2020-07-16 MED ORDER — IBUPROFEN 800 MG PO TABS: 800.0000 mg | ORAL_TABLET | Freq: Three times a day (TID) | ORAL | 0 refills | Status: DC | PRN

## 2020-07-16 MED ORDER — FENTANYL CITRATE (PF) 100 MCG/2ML IJ SOLN: 25.0000 ug | INTRAMUSCULAR | Status: DC | PRN

## 2020-07-16 MED ORDER — GLYCOPYRROLATE 0.2 MG/ML IJ SOLN
INTRAMUSCULAR | Status: DC | PRN
  Administered 2020-07-16: .2 mg via INTRAVENOUS

## 2020-07-16 MED ORDER — PROPOFOL 10 MG/ML IV BOLUS
INTRAVENOUS | Status: DC | PRN
  Administered 2020-07-16: 200 mg via INTRAVENOUS

## 2020-07-16 MED ORDER — CHLORHEXIDINE GLUCONATE 0.12 % MT SOLN
OROMUCOSAL | Status: AC
  Administered 2020-07-16: 15 mL via OROMUCOSAL
  Filled 2020-07-16: qty 15

## 2020-07-16 MED ORDER — FAMOTIDINE 20 MG PO TABS
ORAL_TABLET | ORAL | Status: AC
  Administered 2020-07-16: 20 mg via ORAL
  Filled 2020-07-16: qty 1

## 2020-07-16 MED ORDER — DEXMEDETOMIDINE (PRECEDEX) IN NS 20 MCG/5ML (4 MCG/ML) IV SYRINGE
PREFILLED_SYRINGE | INTRAVENOUS | Status: DC | PRN
  Administered 2020-07-16: 4 ug via INTRAVENOUS
  Administered 2020-07-16: 12 ug via INTRAVENOUS
  Administered 2020-07-16: 4 ug via INTRAVENOUS

## 2020-07-16 MED ORDER — CELECOXIB 200 MG PO CAPS
ORAL_CAPSULE | ORAL | Status: AC
  Administered 2020-07-16: 200 mg via ORAL
  Filled 2020-07-16: qty 1

## 2020-07-16 MED ORDER — CEFAZOLIN SODIUM-DEXTROSE 2-4 GM/100ML-% IV SOLN
INTRAVENOUS | Status: AC
  Filled 2020-07-16: qty 100

## 2020-07-16 MED ORDER — ONDANSETRON HCL 4 MG/2ML IJ SOLN
INTRAMUSCULAR | Status: DC | PRN
  Administered 2020-07-16: 4 mg via INTRAVENOUS

## 2020-07-16 SURGICAL SUPPLY — 34 items
ADH SKN CLS APL DERMABOND .7 (GAUZE/BANDAGES/DRESSINGS) ×1
BLADE SURG 15 STRL LF DISP TIS (BLADE) ×1 IMPLANT
BLADE SURG 15 STRL SS (BLADE) ×2
COVER WAND RF STERILE (DRAPES) ×2 IMPLANT
DERMABOND ADVANCED (GAUZE/BANDAGES/DRESSINGS) ×1
DERMABOND ADVANCED .7 DNX12 (GAUZE/BANDAGES/DRESSINGS) IMPLANT
DRAIN CHANNEL JP 15F RND 16 (MISCELLANEOUS) ×1 IMPLANT
DRAIN PENROSE 12X.25 LTX STRL (MISCELLANEOUS) IMPLANT
DRAIN PENROSE 5/8X18 LTX STRL (DRAIN) IMPLANT
DRAPE LAPAROTOMY 77X122 PED (DRAPES) ×2 IMPLANT
ELECT CAUTERY BLADE 6.4 (BLADE) ×2 IMPLANT
ELECT REM PT RETURN 9FT ADLT (ELECTROSURGICAL) ×2
ELECTRODE REM PT RTRN 9FT ADLT (ELECTROSURGICAL) ×1 IMPLANT
GAUZE SPONGE 4X4 12PLY STRL (GAUZE/BANDAGES/DRESSINGS) IMPLANT
GLOVE SURG ORTHO LTX SZ7.5 (GLOVE) ×2 IMPLANT
GOWN STRL REUS W/ TWL LRG LVL3 (GOWN DISPOSABLE) ×2 IMPLANT
GOWN STRL REUS W/TWL LRG LVL3 (GOWN DISPOSABLE) ×4
KIT TURNOVER KIT A (KITS) ×2 IMPLANT
MANIFOLD NEPTUNE II (INSTRUMENTS) ×2 IMPLANT
NEEDLE HYPO 22GX1.5 SAFETY (NEEDLE) ×2 IMPLANT
NS IRRIG 1000ML POUR BTL (IV SOLUTION) ×2 IMPLANT
PACK BASIN MINOR ARMC (MISCELLANEOUS) ×2 IMPLANT
PAD ABD DERMACEA PRESS 5X9 (GAUZE/BANDAGES/DRESSINGS) IMPLANT
SOL PREP PVP 2OZ (MISCELLANEOUS) ×2
SOLUTION PREP PVP 2OZ (MISCELLANEOUS) ×1 IMPLANT
SPONGE LAP 18X18 RF (DISPOSABLE) ×2 IMPLANT
SPONGE VERSALON 4X4 4PLY (MISCELLANEOUS) IMPLANT
SUCT RESERVOIR 100CC (MISCELLANEOUS) ×1 IMPLANT
SUT ETHILON 3-0 FS-10 30 BLK (SUTURE)
SUT VIC AB 4-0 PS2 27 (SUTURE) ×2 IMPLANT
SUTURE EHLN 3-0 FS-10 30 BLK (SUTURE) IMPLANT
SWAB CULTURE AMIES ANAERIB BLU (MISCELLANEOUS) ×2 IMPLANT
SYR 10ML LL (SYRINGE) ×2 IMPLANT
SYR BULB IRRIG 60ML STRL (SYRINGE) ×2 IMPLANT

## 2020-07-16 NOTE — Anesthesia Postprocedure Evaluation (Signed)
Anesthesia Post Note  Patient: Laura Benton  Procedure(s) Performed: drain placement, drainage of post operative seroma  Patient location during evaluation: PACU Anesthesia Type: General Level of consciousness: awake and alert, awake and oriented Pain management: pain level controlled Vital Signs Assessment: post-procedure vital signs reviewed and stable Respiratory status: spontaneous breathing, nonlabored ventilation and respiratory function stable Cardiovascular status: blood pressure returned to baseline and stable Postop Assessment: no apparent nausea or vomiting Anesthetic complications: no   No notable events documented.   Last Vitals:  Vitals:   07/16/20 1649 07/16/20 1735  BP: (!) 149/88 (!) 155/95  Pulse: 68 65  Resp: 14 14  Temp: (!) 36.1 C   SpO2: 100% 99%    Last Pain:  Vitals:   07/16/20 1649  TempSrc: Temporal  PainSc: 0-No pain                 Manfred Arch

## 2020-07-16 NOTE — Op Note (Signed)
Drainage of postoperative seroma with drain placement.  Pre-operative Diagnosis: Postoperative seroma, increasingly tender.  Post-operative Diagnosis: same.   Surgeon: Campbell Lerner, M.D., FACS  Anesthesia: General   Findings: Entire cavity apparently without appreciable loculation.  Clear serous fluid drained completely.  15 Blake drain secured.  Estimated Blood Loss: 1 mL         Specimens: None          Complications: none              Procedure Details  The patient was seen again in the Holding Room. The benefits, complications, treatment options, and expected outcomes were discussed with the patient. The risks of bleeding, infection, recurrence of symptoms, failure to resolve symptoms, unanticipated injury, prosthetic placement, prosthetic infection, any of which could require further surgery were reviewed with the patient. The likelihood of improving the patient's symptoms with return to their baseline status is good.  The patient and/or family concurred with the proposed plan, giving informed consent.  The patient was taken to Operating Room, identified and the procedure verified.    Prior to the induction of general anesthesia, antibiotic prophylaxis was administered. VTE prophylaxis was in place.  General anesthesia was then administered and tolerated well. After the induction, the patient was positioned in the supine position and the right lower quadrant/abdomen was prepped with Chloraprep and draped in the sterile fashion.  A Time Out was held and the above information confirmed.  A stab incision was made with the trocar attached to the 15 Blake drain, I entered the palpable seroma cavity with the trocar, and traversed laterally to place exit with the same trocar.  I then withdrew the trocar out of the lateral incision along with the drain accompanying it.  Proceeded with advancing the remainder of the drain into the cavity.  I then secured the drain at the exit site with 3-0  nylon.  After emptying the cavity I then closed the entry's skin incision with a 4-0 subcuticular of Monocryl.  The site was then sealed with Dermabond.  Dressing placed around the drain site.  Local infiltration of quarter percent Marcaine with epinephrine utilized at both incision sites. Patient tolerated procedure well large volume seroma removed.  No evidence of infection.  Seems entire seroma drained completely.       Campbell Lerner M.D., [facility] 07/16/2020 3:04 PM

## 2020-07-16 NOTE — Anesthesia Preprocedure Evaluation (Addendum)
Anesthesia Evaluation  Patient identified by MRN, date of birth, ID band Patient awake    Reviewed: Allergy & Precautions, NPO status , Patient's Chart, lab work & pertinent test results  History of Anesthesia Complications (+) DIFFICULT AIRWAY, PROLONGED EMERGENCE and history of anesthetic complications  Airway Mallampati: II  TM Distance: >3 FB Neck ROM: Full    Dental no notable dental hx. (+) Teeth Intact   Pulmonary sleep apnea , Current Smoker, former smoker,  Denies Pulm issues  Never formally tested for OSA Snores    Pulmonary exam normal breath sounds clear to auscultation       Cardiovascular Exercise Tolerance: Good hypertension, Normal cardiovascular examI+ Valvular Problems/Murmurs  Rhythm:Regular Rate:Normal     Neuro/Psych Anxiety negative neurological ROS  negative psych ROS   GI/Hepatic Neg liver ROS, GERD  ,Denies GERD Sx today or any chronic meds for GERD  States occasional OTCs   Endo/Other  Morbid obesityBMI>45  Renal/GU negative Renal ROS  negative genitourinary   Musculoskeletal negative musculoskeletal ROS (+)   Abdominal (+) + obese,   Peds negative pediatric ROS (+)  Hematology negative hematology ROS (+) anemia ,   Anesthesia Other Findings Anemia    Anxiety  no meds  Complication of anesthesia  hard to wake up  Elevated blood pressure reading  GERD (gastroesophageal reflux disease)  no meds  Heart murmur  early 20's  Incisional hernia, without obstruction or gangrene 03/31/2018   Vaginal Pap smear, abnormal       Reproductive/Obstetrics negative OB ROS                             Anesthesia Physical  Anesthesia Plan  ASA: 3  Anesthesia Plan: General   Post-op Pain Management:    Induction: Intravenous  PONV Risk Score and Plan: 3 and Dexamethasone, Ondansetron, Treatment may vary due to age or medical condition and Midazolam  Airway  Management Planned: LMA  Additional Equipment:   Intra-op Plan:   Post-operative Plan:   Informed Consent: I have reviewed the patients History and Physical, chart, labs and discussed the procedure including the risks, benefits and alternatives for the proposed anesthesia with the patient or authorized representative who has indicated his/her understanding and acceptance.     Dental advisory given  Plan Discussed with: CRNA, Anesthesiologist and Surgeon  Anesthesia Plan Comments: (Plan Full PPE use  Plan GETA )       Anesthesia Quick Evaluation

## 2020-07-16 NOTE — Anesthesia Procedure Notes (Signed)
Procedure Name: LMA Insertion Date/Time: 07/16/2020 2:28 PM Performed by: Omer Jack, CRNA Pre-anesthesia Checklist: Patient identified, Patient being monitored, Timeout performed, Emergency Drugs available and Suction available Patient Re-evaluated:Patient Re-evaluated prior to induction Oxygen Delivery Method: Circle system utilized Preoxygenation: Pre-oxygenation with 100% oxygen Induction Type: IV induction Ventilation: Mask ventilation without difficulty LMA: LMA inserted LMA Size: 4.5 Tube type: Oral Number of attempts: 1 Placement Confirmation: positive ETCO2 and breath sounds checked- equal and bilateral Tube secured with: Tape Dental Injury: Teeth and Oropharynx as per pre-operative assessment

## 2020-07-16 NOTE — Interval H&P Note (Signed)
History and Physical Interval Note:  07/16/2020 2:10 PM  Laura Benton  has presented today for surgery, with the diagnosis of LLQ seroma.  The various methods of treatment have been discussed with the patient and family. After consideration of risks, benefits and other options for treatment, the patient has consented to  Procedure(s): drain placement, drainage of post operative seroma (N/A) as a surgical intervention.  The patient's history has been reviewed, patient examined, no change in status, stable for surgery.  I have reviewed the patient's chart and labs.  Questions were answered to the patient's satisfaction.     Campbell Lerner

## 2020-07-16 NOTE — Anesthesia Procedure Notes (Deleted)
Procedure Name: LMA Insertion Date/Time: 07/16/2020 2:28 PM Performed by: Mohammed Kindle, CRNA Pre-anesthesia Checklist: Patient identified, Emergency Drugs available, Suction available and Patient being monitored Patient Re-evaluated:Patient Re-evaluated prior to induction Oxygen Delivery Method: Circle system utilized Preoxygenation: Pre-oxygenation with 100% oxygen Induction Type: IV induction LMA: LMA inserted LMA Size: 4.5 Placement Confirmation: positive ETCO2, CO2 detector and breath sounds checked- equal and bilateral Tube secured with: Tape Dental Injury: Teeth and Oropharynx as per pre-operative assessment

## 2020-07-16 NOTE — Discharge Instructions (Addendum)
AMBULATORY SURGERY  DISCHARGE INSTRUCTIONS   The drugs that you were given will stay in your system until tomorrow so for the next 24 hours you should not:  Drive an automobile Make any legal decisions Drink any alcoholic beverage   You may resume regular meals tomorrow.  Today it is better to start with liquids and gradually work up to solid foods.  You may eat anything you prefer, but it is better to start with liquids, then soup and crackers, and gradually work up to solid foods.   Please notify your doctor immediately if you have any unusual bleeding, trouble breathing, redness and pain at the surgery site, drainage, fever, or pain not relieved by medication.       Please contact your physician with any problems or Same Day Surgery at [phone], Monday through Friday 6 am to 4 pm, or [place] [facility] [place] [facility] number at [phone].  

## 2020-07-16 NOTE — Transfer of Care (Signed)
Immediate Anesthesia Transfer of Care Note  Patient: Laura Benton  Procedure(s) Performed: drain placement, drainage of post operative seroma  Patient Location: PACU  Anesthesia Type:General  Level of Consciousness: awake, drowsy and patient cooperative  Airway & Oxygen Therapy: Patient Spontanous Breathing and Patient connected to face mask oxygen  Post-op Assessment: Report given to RN and Post -op Vital signs reviewed and stable  Post vital signs: Reviewed and stable  Last Vitals:  Vitals Value Taken Time  BP 122/75 07/16/20 1502  Temp    Pulse 71 07/16/20 1504  Resp 12 07/16/20 1504  SpO2 100 % 07/16/20 1504  Vitals shown include unvalidated device data.  Last Pain:  Vitals:   07/16/20 1348  PainSc: 0-No pain         Complications: No notable events documented.

## 2020-07-17 ENCOUNTER — Encounter: Payer: Self-pay | Admitting: Surgery

## 2020-07-21 ENCOUNTER — Telehealth: Payer: Self-pay | Admitting: Surgery

## 2020-07-21 NOTE — Telephone Encounter (Signed)
error 

## 2020-07-23 ENCOUNTER — Ambulatory Visit (INDEPENDENT_AMBULATORY_CARE_PROVIDER_SITE_OTHER): Payer: Medicaid Other | Admitting: Physician Assistant

## 2020-07-23 ENCOUNTER — Other Ambulatory Visit: Payer: Self-pay

## 2020-07-23 ENCOUNTER — Encounter: Payer: Self-pay | Admitting: Physician Assistant

## 2020-07-23 VITALS — BP 135/89 | HR 85 | Temp 98.5°F | Ht 65.0 in | Wt 236.2 lb

## 2020-07-23 DIAGNOSIS — Z09 Encounter for follow-up examination after completed treatment for conditions other than malignant neoplasm: Secondary | ICD-10-CM

## 2020-07-23 NOTE — Progress Notes (Signed)
[facility] POST-OP OFFICE VISIT  07/23/2020  HPI: Laura Benton is a 40 y.o. female 7 days s/p operative drainage of seroma with Dr Claudine Mouton  She is doing well Having around 25 ccs or less of serous drainage from the drain. I was able to strip clotting from the tubing No fever, chills, pain She reports her abdomen has returned to a normal size No other issues  Vital signs: BP 135/89   Pulse 85   Temp 98.5 F (36.9 C)   Ht 5\' 5"  (1.651 m)   Wt 236 lb 3.2 oz (107.1 kg)   LMP 06/18/2020   SpO2 97%   BMI 39.31 kg/m    Physical Exam: Constitutional: Well appearing female, NAD Abdomen: Soft, non-tender, non-distended, no rebound/guarding. Drain in the RLQ; output serous   Assessment/Plan: This is a 40 y.o. female 7 days s/p operative drainage of seroma   - Given low output from drain, and at patient request, I went ahead and removed her drain today  - Reviewed local wound care  - She can follow up on as needed basis  -- 41, PA-C [facility] 07/23/2020, 11:58 AM [phone] M-F: 7am - 4pm

## 2020-07-23 NOTE — Patient Instructions (Signed)
Please call if you have any questions or concerns.  °

## 2020-08-25 ENCOUNTER — Ambulatory Visit: Payer: Medicaid Other | Admitting: Family Medicine

## 2020-10-08 ENCOUNTER — Other Ambulatory Visit: Payer: Self-pay

## 2020-10-09 ENCOUNTER — Ambulatory Visit: Payer: Medicaid Other | Admitting: Nurse Practitioner

## 2020-10-09 DIAGNOSIS — F32A Depression, unspecified: Secondary | ICD-10-CM | POA: Insufficient documentation

## 2020-10-09 DIAGNOSIS — F419 Anxiety disorder, unspecified: Secondary | ICD-10-CM | POA: Insufficient documentation

## 2020-10-09 DIAGNOSIS — K589 Irritable bowel syndrome without diarrhea: Secondary | ICD-10-CM | POA: Insufficient documentation

## 2020-10-09 NOTE — Progress Notes (Deleted)
There were no vitals taken for this visit.   Subjective:    Patient ID: Janayia Burggraf, female    DOB: 1980/11/17, 40 y.o.   MRN: [id number]  HPI: Arleth Mccullar is a 40 y.o. female  No chief complaint on file.  Patient presents to clinic to establish care with new PCP.  Patient reports a history of ***. Patient denies a history of: Hypertension, Elevated Cholesterol, Diabetes, Thyroid problems, Depression, Anxiety, Neurological problems, and Abdominal problems.   Active Ambulatory Problems    Diagnosis Date Noted   Abdominal pain 06/10/2017   Recurrent incisional hernia 07/12/2019   Tobacco abuse 07/12/2019   Morbid obesity (HCC) 07/12/2019   Anxiety 10/09/2020   Depression 10/09/2020   Irritable bowel syndrome (IBS) 10/09/2020   Resolved Ambulatory Problems    Diagnosis Date Noted   Incisional hernia, without obstruction or gangrene 03/31/2018   Past Medical History:  Diagnosis Date   Anemia    Complication of anesthesia    Difficult intubation    Elevated blood pressure reading    GERD (gastroesophageal reflux disease)    Head trauma 05/2020   Heart murmur    History of 2019 novel coronavirus disease (COVID-19) 01/2020   Vaginal Pap smear, abnormal    Past Surgical History:  Procedure Laterality Date   CESAREAN SECTION     x2.  2000, 2019   INCISION AND DRAINAGE ABSCESS N/A 07/16/2020   Procedure: drain placement, drainage of post operative seroma;  Surgeon: Campbell Lerner, MD;  Location: [facility] ORS;  Service: General;  Laterality: N/A;   INCISIONAL HERNIA REPAIR N/A 07/12/2018   Procedure: LAPAROSCOPIC INCISIONAL ABDOMINAL WALL HERNIA REPAIR;  Surgeon: Ancil Linsey, MD;  Location: [facility]S;  Service: General;  Laterality: N/A;   right shoulder surgery Right    tear of rotator cuff.   TONSILLECTOMY     UTERINE FIBROID SURGERY     Family History  Problem Relation Age of Onset   Throat cancer Father    Anemia Sister    Pulmonary disease  Son       Review of Systems  Per HPI unless specifically indicated above     Objective:    There were no vitals taken for this visit.  Wt Readings from Last 3 Encounters:  07/23/20 236 lb 3.2 oz (107.1 kg)  07/16/20 237 lb (107.5 kg)  07/14/20 237 lb (107.5 kg)    Physical Exam  Results for orders placed or performed during the hospital encounter of 07/16/20  CBC WITH DIFFERENTIAL  Result Value Ref Range   WBC 6.4 4.0 - 10.5 K/uL   RBC 4.69 3.87 - 5.11 MIL/uL   Hemoglobin 13.6 12.0 - 15.0 g/dL   HCT 55.7 32.2 - 02.5 %   MCV 87.2 80.0 - 100.0 fL   MCH 29.0 26.0 - 34.0 pg   MCHC 33.3 30.0 - 36.0 g/dL   RDW 42.7 06.2 - 37.6 %   Platelets 323 150 - 400 K/uL   nRBC 0.0 0.0 - 0.2 %   Neutrophils Relative % 63 %   Neutro Abs 4.2 1.7 - 7.7 K/uL   Lymphocytes Relative 26 %   Lymphs Abs 1.7 0.7 - 4.0 K/uL   Monocytes Relative 8 %   Monocytes Absolute 0.5 0.1 - 1.0 K/uL   Eosinophils Relative 1 %   Eosinophils Absolute 0.1 0.0 - 0.5 K/uL   Basophils Relative 1 %   Basophils Absolute 0.0 0.0 - 0.1 K/uL   Immature Granulocytes 1 %  Abs Immature Granulocytes 0.03 0.00 - 0.07 K/uL  Comprehensive metabolic panel  Result Value Ref Range   Sodium 139 135 - 145 mmol/L   Potassium 4.0 3.5 - 5.1 mmol/L   Chloride 107 98 - 111 mmol/L   CO2 26 22 - 32 mmol/L   Glucose, Bld 113 (H) 70 - 99 mg/dL   BUN 8 6 - 20 mg/dL   Creatinine, Ser 3.30 0.44 - 1.00 mg/dL   Calcium 8.9 8.9 - 07.6 mg/dL   Total Protein 7.6 6.5 - 8.1 g/dL   Albumin 3.7 3.5 - 5.0 g/dL   AST 19 15 - 41 U/L   ALT 13 0 - 44 U/L   Alkaline Phosphatase 59 38 - 126 U/L   Total Bilirubin 0.9 0.3 - 1.2 mg/dL   GFR, Estimated >22 >63 mL/min   Anion gap 6 5 - 15  Pregnancy, urine POC  Result Value Ref Range   Preg Test, Ur NEGATIVE NEGATIVE      Assessment & Plan:   Problem List Items Addressed This Visit       Other   Tobacco abuse - Primary   Anxiety     Follow up plan: No follow-ups on  file.

## 2021-06-01 ENCOUNTER — Emergency Department
Admission: EM | Admit: 2021-06-01 | Discharge: 2021-06-01 | Disposition: A | Discharge status: Home / Self Care | Payer: Medicaid Other | Attending: Emergency Medicine | Admitting: Emergency Medicine

## 2021-06-01 ENCOUNTER — Other Ambulatory Visit: Payer: Self-pay

## 2021-06-01 ENCOUNTER — Emergency Department: Payer: Medicaid Other

## 2021-06-01 DIAGNOSIS — Z8616 Personal history of COVID-19: Secondary | ICD-10-CM | POA: Diagnosis not present

## 2021-06-01 DIAGNOSIS — O99311 Alcohol use complicating pregnancy, first trimester: Secondary | ICD-10-CM | POA: Insufficient documentation

## 2021-06-01 DIAGNOSIS — Z046 Encounter for general psychiatric examination, requested by authority: Secondary | ICD-10-CM | POA: Insufficient documentation

## 2021-06-01 DIAGNOSIS — R45851 Suicidal ideations: Secondary | ICD-10-CM | POA: Diagnosis not present

## 2021-06-01 DIAGNOSIS — F10120 Alcohol abuse with intoxication, uncomplicated: Secondary | ICD-10-CM | POA: Diagnosis not present

## 2021-06-01 DIAGNOSIS — R102 Pelvic and perineal pain: Secondary | ICD-10-CM | POA: Diagnosis not present

## 2021-06-01 DIAGNOSIS — F419 Anxiety disorder, unspecified: Secondary | ICD-10-CM | POA: Diagnosis not present

## 2021-06-01 DIAGNOSIS — F418 Other specified anxiety disorders: Secondary | ICD-10-CM | POA: Insufficient documentation

## 2021-06-01 DIAGNOSIS — Z331 Pregnant state, incidental: Secondary | ICD-10-CM

## 2021-06-01 DIAGNOSIS — Z3480 Encounter for supervision of other normal pregnancy, unspecified trimester: Secondary | ICD-10-CM | POA: Insufficient documentation

## 2021-06-01 DIAGNOSIS — O99341 Other mental disorders complicating pregnancy, first trimester: Secondary | ICD-10-CM | POA: Diagnosis present

## 2021-06-01 DIAGNOSIS — F32A Depression, unspecified: Secondary | ICD-10-CM

## 2021-06-01 DIAGNOSIS — F10920 Alcohol use, unspecified with intoxication, uncomplicated: Secondary | ICD-10-CM

## 2021-06-01 DIAGNOSIS — R000 Tachycardia, unspecified: Secondary | ICD-10-CM | POA: Diagnosis not present

## 2021-06-01 DIAGNOSIS — Y906 Blood alcohol level of 120-199 mg/100 ml: Secondary | ICD-10-CM | POA: Insufficient documentation

## 2021-06-01 DIAGNOSIS — O0990 Supervision of high risk pregnancy, unspecified, unspecified trimester: Secondary | ICD-10-CM | POA: Insufficient documentation

## 2021-06-01 LAB — URINALYSIS, ROUTINE W REFLEX MICROSCOPIC
Bacteria, UA: NONE SEEN
Bilirubin Urine: NEGATIVE
Glucose, UA: NEGATIVE mg/dL
Ketones, ur: NEGATIVE mg/dL
Leukocytes,Ua: NEGATIVE
Nitrite: NEGATIVE
Protein, ur: 100 mg/dL — AB
Specific Gravity, Urine: 1.014 (ref 1.005–1.030)
pH: 5 (ref 5.0–8.0)

## 2021-06-01 LAB — URINE DRUG SCREEN, QUALITATIVE (ARMC ONLY)
Amphetamines, Ur Screen: NOT DETECTED
Barbiturates, Ur Screen: NOT DETECTED
Benzodiazepine, Ur Scrn: NOT DETECTED
Cannabinoid 50 Ng, Ur ~~LOC~~: NOT DETECTED
Cocaine Metabolite,Ur ~~LOC~~: NOT DETECTED
MDMA (Ecstasy)Ur Screen: NOT DETECTED
Methadone Scn, Ur: NOT DETECTED
Opiate, Ur Screen: NOT DETECTED
Phencyclidine (PCP) Ur S: NOT DETECTED
Tricyclic, Ur Screen: NOT DETECTED

## 2021-06-01 LAB — COMPREHENSIVE METABOLIC PANEL
ALT: 19 U/L (ref 0–44)
AST: 41 U/L (ref 15–41)
Albumin: 4 g/dL (ref 3.5–5.0)
Alkaline Phosphatase: 45 U/L (ref 38–126)
Anion gap: 10 (ref 5–15)
BUN: 14 mg/dL (ref 6–20)
CO2: 20 mmol/L — ABNORMAL LOW (ref 22–32)
Calcium: 8.9 mg/dL (ref 8.9–10.3)
Chloride: 110 mmol/L (ref 98–111)
Creatinine, Ser: 1.23 mg/dL — ABNORMAL HIGH (ref 0.44–1.00)
GFR, Estimated: 57 mL/min — ABNORMAL LOW (ref >60)
Glucose, Bld: 111 mg/dL — ABNORMAL HIGH (ref 70–99)
Potassium: 3.9 mmol/L (ref 3.5–5.1)
Sodium: 140 mmol/L (ref 135–145)
Total Bilirubin: 0.4 mg/dL (ref 0.3–1.2)
Total Protein: 7.7 g/dL (ref 6.5–8.1)

## 2021-06-01 LAB — CBC
HCT: 40 % (ref 36.0–46.0)
Hemoglobin: 13 g/dL (ref 12.0–15.0)
MCH: 28.8 pg (ref 26.0–34.0)
MCHC: 32.5 g/dL (ref 30.0–36.0)
MCV: 88.5 fL (ref 80.0–100.0)
Platelets: 257 10*3/uL (ref 150–400)
RBC: 4.52 MIL/uL (ref 3.87–5.11)
RDW: 13 % (ref 11.5–15.5)
WBC: 5.5 10*3/uL (ref 4.0–10.5)
nRBC: 0 % (ref 0.0–0.2)

## 2021-06-01 LAB — HCG, QUANTITATIVE, PREGNANCY: hCG, Beta Chain, Quant, S: 324 m[IU]/mL — ABNORMAL HIGH (ref <5)

## 2021-06-01 LAB — ACETAMINOPHEN LEVEL: Acetaminophen (Tylenol), Serum: <10 ug/mL — ABNORMAL LOW (ref 10–30)

## 2021-06-01 LAB — SALICYLATE LEVEL: Salicylate Lvl: <7 mg/dL — ABNORMAL LOW (ref 7.0–30.0)

## 2021-06-01 LAB — POC URINE PREG, ED: Preg Test, Ur: POSITIVE — AB

## 2021-06-01 LAB — ETHANOL: Alcohol, Ethyl (B): 199 mg/dL — ABNORMAL HIGH (ref <10)

## 2021-06-01 NOTE — Consult Note (Signed)
[facility] Face-to-Face Psychiatry Consult   Reason for Consult:  Psychiatric Evaluation Referring Physician:  Dr. Beather Arbour  Patient Identification: Laura Benton MRN:  [id number] Principal Diagnosis: Depression Diagnosis:  Principal Problem:   Depression Active Problems:   Anxiety   Total Time spent with patient: 45 minutes  Subjective:  "My home life is overwhelming"   HPI:  Laura Benton, 41 y.o., female patient seen by this provider, consulted with Dr. Beather Arbour; and chart reviewed on 06/01/21.  On evaluation [name] reports that her home life is overwhelming and admits to screaming and yelling at her boyfriend. She admits to saying that she wants to give up. However, during assessment she listed reasons she would NEVER kill herself.  She has two children 41 year old son and a 66 year old daughter, whom she states is a handful. She has also found out that she is pregnant again.  Patient says she is willing to follow-up on an outpatient basis for mental health needs.    Per EDP,  she was brought to the ED under IVC with [facility] for suicidal ideation with gun.  Patient stated she is under multiple stressors and is ready to give up.  Denies HI/AH/VH.  Reports last menstrual period 2 months ago, skipped last month. Endorses occasional pelvic cramping.  Denies chest pain, shortness of breath, nausea, vomiting or vaginal bleeding.  Her thought process is coherent and relevant; There is no indication that she is currently responding to internal/external stimuli or experiencing delusional thought content; and she has denied suicidal/self-harm/homicidal ideation, psychosis, and paranoia.  Patient has remained calm throughout assessment and has answered questions appropriately.     Past Psychiatric History: Anxiety and depression  Risk to Self:   Risk to Others:   Prior Inpatient Therapy:   Prior Outpatient Therapy:    Past Medical History:  Past Medical History:  Diagnosis  Date   Anemia    Anxiety    no meds   Complication of anesthesia    delayed emergence   Difficult intubation    Elevated blood pressure reading    GERD (gastroesophageal reflux disease)    no meds   Head trauma 05/2020   staple in scalp to repair laceration   Heart murmur    early 20's   History of 2019 novel coronavirus disease (COVID-19) 01/2020   Incisional hernia, without obstruction or gangrene 03/31/2018   Vaginal Pap smear, abnormal     Past Surgical History:  Procedure Laterality Date   CESAREAN SECTION     x2.  2000, 2019   INCISION AND DRAINAGE ABSCESS N/A 07/16/2020   Procedure: drain placement, drainage of post operative seroma;  Surgeon: Ronny Bacon, MD;  Location: [facility] ORS;  Service: General;  Laterality: N/A;   [place] N/A 07/12/2018   Procedure: [place];  Surgeon: Vickie Epley, MD;  Location: [facility];  Service: General;  Laterality: N/A;   right shoulder surgery Right    tear of rotator cuff.   TONSILLECTOMY     UTERINE FIBROID SURGERY     Family History:  Family History  Problem Relation Age of Onset   Throat cancer Father    Anemia Sister    Pulmonary disease Son    Family Psychiatric  History: unknown Social History:  Social History   Substance and Sexual Activity  Alcohol Use Yes   Comment: beer/liqour qd     Social History   Substance and Sexual Activity  Drug Use  Never    Social History   Socioeconomic History   Marital status: Widowed    Spouse name: Not on file   Number of children: Not on file   Years of education: Not on file   Highest education level: Not on file  Occupational History   Occupation: [employer]  Tobacco Use   Smoking status: Some Days    Types: Cigars   Smokeless tobacco: Never   Tobacco comments:    black and milds  Vaping Use   Vaping Use: Never used  Substance and Sexual Activity   Alcohol use: Yes    Comment:  beer/liqour qd   Drug use: Never   Sexual activity: Yes    Birth control/protection: None  Other Topics Concern   Not on file  Social History Narrative   Patient lives alone with youngest child. Feels safe in her home.   Not sure of who will bring her to the hospital or who can stay with her.      Patient does not want to receive any blood products.  Will have her sign       Refusal of blood papers.   Social Determinants of Health   Financial Resource Strain: Not on file  Food Insecurity: Not on file  Transportation Needs: Not on file  Physical Activity: Not on file  Stress: Not on file  Social Connections: Not on file   Additional Social History:    Allergies:  No Known Allergies  Labs:  Results for orders placed or performed during the hospital encounter of 06/01/21 (from the past 48 hour(s))  Comprehensive metabolic panel     Status: Abnormal   Collection Time: 06/01/21  4:45 AM  Result Value Ref Range   Sodium 140 135 - 145 mmol/L   Potassium 3.9 3.5 - 5.1 mmol/L   Chloride 110 98 - 111 mmol/L   CO2 20 (L) 22 - 32 mmol/L   Glucose, Bld 111 (H) 70 - 99 mg/dL    Comment: Glucose reference range applies only to samples taken after fasting for at least 8 hours.   BUN 14 6 - 20 mg/dL   Creatinine, Ser 9.44 (H) 0.44 - 1.00 mg/dL   Calcium 8.9 8.9 - 96.7 mg/dL   Total Protein 7.7 6.5 - 8.1 g/dL   Albumin 4.0 3.5 - 5.0 g/dL   AST 41 15 - 41 U/L   ALT 19 0 - 44 U/L   Alkaline Phosphatase 45 38 - 126 U/L   Total Bilirubin 0.4 0.3 - 1.2 mg/dL   GFR, Estimated 57 (L) >60 mL/min    Comment: (NOTE) Calculated using the CKD-EPI Creatinine Equation (2021)    Anion gap 10 5 - 15    Comment: Performed at [facility], [address] [place], [place] [address]  Ethanol     Status: Abnormal   Collection Time: 06/01/21  4:45 AM  Result Value Ref Range   Alcohol, Ethyl (B) 199 (H) <10 mg/dL    Comment: (NOTE) Lowest detectable limit for serum alcohol is 10  mg/dL.  For medical purposes only. Performed at [facility], [address] [place], [place] [address]   Salicylate level     Status: Abnormal   Collection Time: 06/01/21  4:45 AM  Result Value Ref Range   Salicylate Lvl <7.0 (L) 7.0 - 30.0 mg/dL    Comment: Performed at [facility], [address], [place], [place] [address]  Acetaminophen level  Status: Abnormal   Collection Time: 06/01/21  4:45 AM  Result Value Ref Range   Acetaminophen (Tylenol), Serum <10 (L) 10 - 30 ug/mL    Comment: (NOTE) Therapeutic concentrations vary significantly. A range of 10-30 ug/mL  may be an effective concentration for many patients. However, some  are best treated at concentrations outside of this range. Acetaminophen concentrations >150 ug/mL at 4 hours after ingestion  and >50 ug/mL at 12 hours after ingestion are often associated with  toxic reactions.  Performed at [facility], [address] [place], [place] [address]   cbc     Status: None   Collection Time: 06/01/21  4:45 AM  Result Value Ref Range   WBC 5.5 4.0 - 10.5 K/uL   RBC 4.52 3.87 - 5.11 MIL/uL   Hemoglobin 13.0 12.0 - 15.0 g/dL   HCT 40.0 36.0 - 46.0 %   MCV 88.5 80.0 - 100.0 fL   MCH 28.8 26.0 - 34.0 pg   MCHC 32.5 30.0 - 36.0 g/dL   RDW 13.0 11.5 - 15.5 %   Platelets 257 150 - 400 K/uL   nRBC 0.0 0.0 - 0.2 %    Comment: Performed at [facility], [address], [place], [place] [address]  Urine Drug Screen, Qualitative     Status: None   Collection Time: 06/01/21  4:45 AM  Result Value Ref Range   Tricyclic, Ur Screen NONE DETECTED NONE DETECTED   Amphetamines, Ur Screen NONE DETECTED NONE DETECTED   MDMA (Ecstasy)Ur Screen NONE DETECTED NONE DETECTED   Cocaine Metabolite,Ur [place] NONE DETECTED NONE DETECTED   Opiate, Ur Screen NONE DETECTED NONE DETECTED   Phencyclidine (PCP) Ur S NONE DETECTED NONE DETECTED   Cannabinoid 50 Ng, Ur [place] NONE DETECTED NONE DETECTED    Barbiturates, Ur Screen NONE DETECTED NONE DETECTED   Benzodiazepine, Ur Scrn NONE DETECTED NONE DETECTED   Methadone Scn, Ur NONE DETECTED NONE DETECTED    Comment: (NOTE) Tricyclics + metabolites, urine    Cutoff 1000 ng/mL Amphetamines + metabolites, urine  Cutoff 1000 ng/mL MDMA (Ecstasy), urine              Cutoff 500 ng/mL Cocaine Metabolite, urine          Cutoff 300 ng/mL Opiate + metabolites, urine        Cutoff 300 ng/mL Phencyclidine (PCP), urine         Cutoff 25 ng/mL Cannabinoid, urine                 Cutoff 50 ng/mL Barbiturates + metabolites, urine  Cutoff 200 ng/mL Benzodiazepine, urine              Cutoff 200 ng/mL Methadone, urine                   Cutoff 300 ng/mL  The urine drug screen provides only a preliminary, unconfirmed analytical test result and should not be used for non-medical purposes. Clinical consideration and professional judgment should be applied to any positive drug screen result due to possible interfering substances. A more specific alternate chemical method must be used in order to obtain a confirmed analytical result. Gas chromatography / mass spectrometry (GC/MS) is the preferred confirm atory method. Performed at [facility], [address] [place], [place] [address]   Urinalysis, Routine w reflex microscopic     Status: Abnormal   Collection Time: 06/01/21  4:45 AM  Result Value Ref Range   Color,  Urine YELLOW (A) YELLOW   APPearance HAZY (A) CLEAR   Specific Gravity, Urine 1.014 1.005 - 1.030   pH 5.0 5.0 - 8.0   Glucose, UA NEGATIVE NEGATIVE mg/dL   Hgb urine dipstick SMALL (A) NEGATIVE   Bilirubin Urine NEGATIVE NEGATIVE   Ketones, ur NEGATIVE NEGATIVE mg/dL   Protein, ur 100 (A) NEGATIVE mg/dL   Nitrite NEGATIVE NEGATIVE   Leukocytes,Ua NEGATIVE NEGATIVE   RBC / HPF 0-5 0 - 5 RBC/hpf   WBC, UA 6-10 0 - 5 WBC/hpf   Bacteria, UA NONE SEEN NONE SEEN   Squamous Epithelial / LPF 6-10 0 - 5   Mucus PRESENT    Hyaline  Casts, UA PRESENT     Comment: Performed at [facility], [address] [place], [place] [address]  POC urine preg, ED     Status: Abnormal   Collection Time: 06/01/21  5:03 AM  Result Value Ref Range   Preg Test, Ur POSITIVE (A) NEGATIVE    Comment:        THE SENSITIVITY OF THIS METHODOLOGY IS >24 mIU/mL     No current facility-administered medications for this encounter.   Current Outpatient Medications  Medication Sig Dispense Refill   escitalopram (LEXAPRO) 10 MG tablet Take 5 mg by mouth daily. (Patient not taking: Reported on 06/01/2021)     ibuprofen (ADVIL) 800 MG tablet Take 1 tablet (800 mg total) by mouth every 8 (eight) hours as needed. (Patient not taking: Reported on 06/01/2021) 30 tablet 0   VENTOLIN HFA 108 (90 Base) MCG/ACT inhaler SMARTSIG:2 Puff(s) By Mouth Every 6 Hours PRN (Patient not taking: Reported on 06/01/2021)      Musculoskeletal: Strength & Muscle Tone: within normal limits Gait & Station: normal Patient leans: N/A  Psychiatric Specialty Exam:  Presentation  General Appearance: No data recorded Eye Contact:No data recorded Speech:No data recorded Speech Volume:No data recorded Handedness:No data recorded  Mood and Affect  Mood:No data recorded Affect:No data recorded  Thought Process  Thought Processes:No data recorded Descriptions of Associations:No data recorded Orientation:No data recorded Thought Content:No data recorded History of Schizophrenia/Schizoaffective disorder:No data recorded Duration of Psychotic Symptoms:No data recorded Hallucinations:No data recorded Ideas of Reference:No data recorded Suicidal Thoughts:No data recorded Homicidal Thoughts:No data recorded  Sensorium  Memory:No data recorded Judgment:No data recorded Insight:No data recorded  Executive Functions  Concentration:No data recorded Attention Span:No data recorded Recall:No data recorded Fund of Knowledge:No data recorded Language:No data  recorded  Psychomotor Activity  Psychomotor Activity:No data recorded  Assets  Assets:No data recorded  Sleep  Sleep:No data recorded  Physical Exam: Physical Exam Vitals and nursing note reviewed.  Constitutional:      Appearance: Normal appearance.  HENT:     Head: Normocephalic and atraumatic.     Nose: Nose normal.     Mouth/Throat:     Mouth: Mucous membranes are dry.  Eyes:     Pupils: Pupils are equal, round, and reactive to light.  Pulmonary:     Effort: Pulmonary effort is normal.  Musculoskeletal:        General: Normal range of motion.     Cervical back: Normal range of motion.  Skin:    General: Skin is warm and dry.  Neurological:     General: No focal deficit present.     Mental Status: She is alert and oriented to person, place, and time. Mental status is at baseline.  Psychiatric:        Attention and Perception:  Attention normal.        Mood and Affect: Affect normal. Mood is depressed.        Speech: Speech normal.        Behavior: Behavior normal. Behavior is cooperative.        Thought Content: Thought content does not include suicidal ideation. Thought content does not include suicidal plan.        Cognition and Memory: Cognition and memory normal.        Judgment: Judgment is not inappropriate.   Review of Systems  Psychiatric/Behavioral:  Positive for depression. Negative for hallucinations, substance abuse and suicidal ideas. The patient is nervous/anxious.   All other systems reviewed and are negative. Blood pressure (!) 141/100, pulse (!) 114, temperature 98.6 F (37 C), temperature source Oral, resp. rate 16, height 5\' 5"  (1.651 m), weight 115.7 kg, SpO2 100 %, currently breastfeeding. Body mass index is 42.43 kg/m.  Treatment Plan Summary: Plan Reassess in the AM  Disposition: No evidence of imminent risk to self or others at present.   Supportive therapy provided about ongoing stressors. Discussed crisis plan, support from social  network, calling 911, coming to the Emergency Department, and calling Suicide Hotline. Reassess for psychiatric evaluation  Deloria Lair, NP 06/01/2021 5:52 AM

## 2021-06-01 NOTE — ED Notes (Signed)
TTS and psych NP talking with patient. ?

## 2021-06-01 NOTE — Discharge Instructions (Addendum)
Please return if you feel more depressed or if you have any symptoms of UTI or abdominal pain or vaginal bleeding or any other problems.  You can follow-up with [facility] for the depression.  Their contact information is on the other sheet of paper that we gave you.  The ultrasound did not show anything in the uterus.  This could be because the pregnancy is very early.  Alternatively it could be because you have a pregnancy in the tubes or elsewhere that we cannot see.  I need you to follow-up with OB/GYN.  Dr. Glennon Mac is on-call.  Please give the office a call let them know that you were seen in the emergency department.  You should be seen again in 2 days and have a repeat blood test done to check to see if the level of the pregnancy hormone is doubling as it should be.  If it is not this could be an indication of a tubal pregnancy.  This could be an emergency. Please return for any increasing abdominal pain or vaginal bleeding, this could be a sign of pregnancy in the tubes. ?

## 2021-06-01 NOTE — ED Triage Notes (Signed)
Pt arrives with IVC with [employer] for SI. Pt states she has been feeling "sad" for awhile.  ?

## 2021-06-01 NOTE — ED Notes (Signed)
Patient states she was in argument with boyfriend and she is under a lot of stress and she is tired.  Patient tearful.  Denies that she wants to hurt herself. ?

## 2021-06-01 NOTE — ED Provider Notes (Signed)
? ?[facility] ?Provider Note ? ? ? Event Date/Time  ? First MD Initiated Contact with Patient 06/01/21 0502   ?  (approximate) ? ? ?History  ? ?IVC ? ? ?HPI ? ?Laura Benton is a 41 y.o. female brought to the ED under IVC with BPD for suicidal ideation with gun.  Patient states she is under multiple stressors and is ready to give up.  Denies HI/AH/VH.  Reports last menstrual period 2 months ago, skipped last month.  Endorses occasional pelvic cramping.  Denies chest pain, shortness of breath, nausea, vomiting or vaginal bleeding. ?  ? ? ?Past Medical History  ? ?Past Medical History:  ?Diagnosis Date  ? Anemia   ? Anxiety   ? no meds  ? Complication of anesthesia   ? delayed emergence  ? Difficult intubation   ? Elevated blood pressure reading   ? GERD (gastroesophageal reflux disease)   ? no meds  ? Head trauma 05/2020  ? staple in scalp to repair laceration  ? Heart murmur   ? early 20's  ? History of 2019 novel coronavirus disease (COVID-19) 01/2020  ? Incisional hernia, without obstruction or gangrene 03/31/2018  ? Vaginal Pap smear, abnormal   ? ? ? ?Active Problem List  ? ?Patient Active Problem List  ? Diagnosis Date Noted  ? Pregnancy as incidental finding   ? Anxiety 10/09/2020  ? Depression 10/09/2020  ? Irritable bowel syndrome (IBS) 10/09/2020  ? 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 Tobacco abuse 07/12/2019  ? Morbid obesity (HCC) 07/12/2019  ? Abdominal pain 06/10/2017  ? ? ? ?Past Surgical History  ? ?Past Surgical History:  ?Procedure Laterality Date  ? CESAREAN SECTION    ? x2.  2000, 2019  ? INCISION AND DRAINAGE ABSCESS N/A 07/16/2020  ? Procedure: drain placement, drainage of post operative seroma;  Surgeon: Campbell Lerner, MD;  Location: [facility] ORS;  Service: General;  Laterality: N/A;  ? INCISIONAL HERNIA REPAIR N/A 07/12/2018  ? Procedure: LAPAROSCOPIC INCISIONAL ABDOMINAL WALL HERNIA REPAIR;  Surgeon: Ancil Linsey, MD;  Location: [facility];  Service:  General;  Laterality: N/A;  ? right shoulder surgery Right   ? tear of rotator cuff.  ? TONSILLECTOMY    ? UTERINE FIBROID SURGERY    ? ? ? ?Home Medications  ? ?Prior to Admission medications   ?Medication Sig Start Date End Date Taking? Authorizing Provider  ?escitalopram (LEXAPRO) 10 MG tablet Take 5 mg by mouth daily. ?Patient not taking: Reported on 06/01/2021 01/06/21   [provider]  ?ibuprofen (ADVIL) 800 MG tablet Take 1 tablet (800 mg total) by mouth every 8 (eight) hours as needed. ?Patient not taking: Reported on 06/01/2021 07/16/20   Campbell Lerner, MD  ?VENTOLIN HFA 108 (90 Base) MCG/ACT inhaler SMARTSIG:2 Puff(s) By Mouth Every 6 Hours PRN ?Patient not taking: Reported on 06/01/2021 01/27/21   [provider]  ? ? ? ?Allergies  ?Patient has no known allergies. ? ? ?Family History  ? ?Family History  ?Problem Relation Age of Onset  ? Throat cancer Father   ? Anemia Sister   ? Pulmonary disease Son   ? ? ? ?Physical Exam  ?Triage Vital Signs: ?ED Triage Vitals [06/01/21 0440]  ?Enc Vitals Group  ?   BP (!) 141/100  ?   Pulse Rate (!) 114  ?   Resp 16  ?   Temp 98.6 ?F (37 ?C)  ?   Temp Source Oral  ?  SpO2 100 %  ?   Weight 255 lb (115.7 kg)  ?   Height 5\' 5"  (1.651 m)  ?   Head Circumference   ?   Peak Flow   ?   Pain Score 0  ?   Pain Loc   ?   Pain Edu?   ?   Excl. in GC?   ? ? ?Updated Vital Signs: ?BP (!) 141/100   Pulse (!) 114   Temp 98.6 ?F (37 ?C) (Oral)   Resp 16   Ht 5\' 5"  (1.651 m)   Wt 115.7 kg   LMP  (LMP Unknown)   SpO2 100%   BMI 42.43 kg/m?  ? ? ?General: Awake, no distress.  ?CV:  Tachycardic.  Good peripheral perfusion.  ?Resp:  Normal effort.  CTA B. ?Abd:  Nontender to light or deep palpation.  No distention.  ?Other:  Depressed affect, tearful after learning she has positive pregnancy test. ? ? ?ED Results / Procedures / Treatments  ?Labs ?(all labs ordered are listed, but only abnormal results are displayed) ?Labs Reviewed  ?COMPREHENSIVE METABOLIC PANEL -  Abnormal; Notable for the following components:  ?    Result Value  ? CO2 20 (*)   ? Glucose, Bld 111 (*)   ? Creatinine, Ser 1.23 (*)   ? GFR, Estimated 57 (*)   ? All other components within normal limits  ?ETHANOL - Abnormal; Notable for the following components:  ? Alcohol, Ethyl (B) 199 (*)   ? All other components within normal limits  ?SALICYLATE LEVEL - Abnormal; Notable for the following components:  ? Salicylate Lvl <7.0 (*)   ? All other components within normal limits  ?ACETAMINOPHEN LEVEL - Abnormal; Notable for the following components:  ? Acetaminophen (Tylenol), Serum <10 (*)   ? All other components within normal limits  ?HCG, QUANTITATIVE, PREGNANCY - Abnormal; Notable for the following components:  ? hCG, Beta Chain, Quant, S 324 (*)   ? All other components within normal limits  ?URINALYSIS, ROUTINE W REFLEX MICROSCOPIC - Abnormal; Notable for the following components:  ? Color, Urine YELLOW (*)   ? APPearance HAZY (*)   ? Hgb urine dipstick SMALL (*)   ? Protein, ur 100 (*)   ? All other components within normal limits  ?POC URINE PREG, ED - Abnormal; Notable for the following components:  ? Preg Test, Ur POSITIVE (*)   ? All other components within normal limits  ?CBC  ?URINE DRUG SCREEN, QUALITATIVE ([facility] ONLY)  ? ? ? ?EKG ? ?None ? ? ?RADIOLOGY ?I have independently visualized and interpreted patient's ultrasound as well as noted the radiology interpretation: ? ?Ultrasound: Pending ? ?Official radiology report(s): ?No results found. ? ? ?PROCEDURES: ? ?Critical Care performed: No ? ?Procedures ? ? ?MEDICATIONS ORDERED IN ED: ?Medications - No data to display ? ? ?IMPRESSION / MDM / ASSESSMENT AND PLAN / ED COURSE  ?I reviewed the triage vital signs and the nursing notes. ?             ?               ?41 year old female who presents to the ED under IVC for suicidal ideation. The patient has been placed in psychiatric observation due to the need to provide a safe environment for the patient  while obtaining psychiatric consultation and evaluation, as well as ongoing medical and medication management to treat the patient's condition.  The patient has been placed under full  IVC at this time. ? ?Given patient's positive POCT and complaints of occasional abdominal cramping, will obtain beta-hCG and ultrasound. ? ?Clinical Course as of 06/01/21 0703  ?Mon Jun 01, 2021  ?1638 Patient evaluated by psychiatric NP who recommends reassessment in the morning. [JS]  ?0702 Care transferred to Dr. Darnelle Catalan pending ultrasound and psychiatric reassessment/disposition. [JS]  ?  ?Clinical Course User Index ?[JS] Irean Hong, MD  ? ? ? ?FINAL CLINICAL IMPRESSION(S) / ED DIAGNOSES  ? ?Final diagnoses:  ?Depression, unspecified depression type  ?Pregnancy as incidental finding  ?Alcoholic intoxication without complication (HCC)  ? ? ? ?Rx / DC Orders  ? ?ED Discharge Orders   ? ? None  ? ?  ? ? ? ?Note:  This document was prepared using Dragon voice recognition software and may include unintentional dictation errors. ?  ?Irean Hong, MD ?06/01/21 0703 ? ?

## 2021-06-01 NOTE — BH Assessment (Signed)
Comprehensive Clinical Assessment (CCA) Note ? ?06/01/2021 ?Laura Benton ?[id number] ? ?Recommendations for Services/Supports/Treatments: ? ?Disposition: Consulted with Rashaun D., NP, who recommended pt. be observed overnight and reassessed in the AM. Notified Dr. Dolores FrameSung and Alvis Lemmingsawn, RN of disposition recommendation.  ? ?Laura Benton is a 41 year old, English speaking, black female with a PMH hx of depression and anxiety. Per pt's first note: Pt arrives with IVC with BPD for SI. Pt states she has been feeling "sad" for a while. Pt explained that her anxiety has worsened and she is prescribed medications but is noncompliant. Pt reported that she lives with her boyfriend and 717-year-old. Pt identified multiple stressors such as parenting, and work. Pt admitted to having anger outbursts. Pt had a tearful affect and a dysphoric mood. Pt's protective factors are being employed, having stable housing and her parenting role. Pt was cooperative throughout the interview. Pt had clear speech and was oriented x4. Pt denied current SI/HIAV/H. Pt's BAL and UDS are unremarkable. Of note pt found out that she is pregnant. ? ?Chief Complaint:  ?Chief Complaint  ?Patient presents with  ? IVC  ? ?Visit Diagnosis: Principal Problem: ?  Depression ?Active Problems: ?  Anxiety  ? ? ?CCA Screening, Triage and Referral (STR) ? ?Patient Reported Information ?How did you hear about us? Other (Comment) Mudlogger(Law enforcement) ? ?Referral name: No data recorded ?Referral phone number: No data recorded ? ?Whom do you see for routine medical problems? No data recorded ?Practice/Facility Name: No data recorded ?Practice/Facility Phone Number: No data recorded ?Name of Contact: No data recorded ?Contact Number: No data recorded ?Contact Fax Number: No data recorded ?Prescriber Name: No data recorded ?Prescriber Address (if known): No data recorded ? ?What Is the Reason for Your Visit/Call Today? Pt arrives with IVC with BPD for SI. Pt  states she has been feeling "sad" for awhile. ? ?How Long Has This Been Causing You Problems? 1 wk - 1 month ? ?What Do You Feel Would Help You the Most Today? Stress Management; Treatment for Depression or other mood problem; Medication(s) ? ? ?Have You Recently Been in Any Inpatient Treatment (Hospital/Detox/Crisis Center/28-Day Program)? No data recorded ?Name/Location of Program/Hospital:No data recorded ?How Long Were You There? No data recorded ?When Were You Discharged? No data recorded ? ?Have You [facility] Received Services From [facility] Before? No data recorded ?Who [facility]? No data recorded ? ?Have You Recently Had Any Thoughts About Hurting Yourself? No ? ?Are You Planning to Commit Suicide/Harm Yourself At This time? No ? ? ?Have you Recently Had Thoughts About Hurting Someone Karolee Ohslse? No data recorded ?Explanation: No data recorded ? ?Have You Used Any Alcohol or Drugs in the Past 24 Hours? No ? ?How Long Ago Did You Use Drugs or Alcohol? No data recorded ?What Did You Use and How Much? No data recorded ? ?Do You Currently Have a Therapist/Psychiatrist? No ? ?Name of Therapist/Psychiatrist: No data recorded ? ?Have You Been Recently Discharged From Any Office Practice or Programs? No ? ?Explanation of Discharge From Practice/Program: No data recorded ? ?  ?CCA Screening Triage Referral Assessment ?Type of Contact: Face-to-Face ? ?Is this Initial or Reassessment? No data recorded ?Date Telepsych consult ordered in [facility]:  No data recorded ?Time Telepsych consult ordered in [facility]:  No data recorded ? ?Patient Reported Information Reviewed? No data recorded ?Patient Left Without Being Seen? No data recorded ?Reason for Not Completing Assessment: No data recorded ? ?Collateral Involvement: None ? ? ?Does Patient  Have a Automotive engineer Guardian? No data recorded ?Name and Contact of Legal Guardian: No data recorded ?If Minor and Not Living with Parent(s), Who has Custody? n/a ? ?Is CPS  involved or ever been involved? Never ? ?Is APS involved or ever been involved? Never ? ? ?Patient Determined To Be At Risk for Harm To Self or Others Based on Review of Patient Reported Information or Presenting Complaint? No ? ?Method: No data recorded ?Availability of Means: No data recorded ?Intent: No data recorded ?Notification Required: No data recorded ?Additional Information for Danger to Others Potential: No data recorded ?Additional Comments for Danger to Others Potential: No data recorded ?Are There Guns or Other Weapons in Your Home? No data recorded ?Types of Guns/Weapons: No data recorded ?Are These Weapons Safely Secured?                            No data recorded ?Who Could Verify You Are Able To Have These Secured: No data recorded ?Do You Have any Outstanding Charges, Pending Court Dates, Parole/Probation? No data recorded ?Contacted To Inform of Risk of Harm To Self or Others: No data recorded ? ?Location of Assessment: [facility] ? ? ?Does Patient Present under Involuntary Commitment? Yes ? ?IVC Papers Initial File Date: 06/01/21 ? ? ?[place] of Residence: [place] ? ? ?Patient Currently Receiving the Following Services: Not Receiving Services ? ? ?Determination of Need: Urgent (48 hours) ? ? ?Options For Referral: Therapeutic Triage Services; ED Referral ? ? ? ? ?CCA Biopsychosocial ?Intake/Chief Complaint:  No data recorded ?Current Symptoms/Problems: No data recorded ? ?Patient Reported Schizophrenia/Schizoaffective Diagnosis in Past: No ? ? ?Strengths: Pt has good insight ? ?Preferences: No data recorded ?Abilities: No data recorded ? ?Type of Services Patient Feels are Needed: No data recorded ? ?Initial Clinical Notes/Concerns: No data recorded ? ?Mental Health Symptoms ?Depression:   ?Irritability; Weight gain/loss; Increase/decrease in appetite; Change in energy/activity; Tearfulness ?  ?Duration of Depressive symptoms:  ?Greater than two weeks ?  ?Mania:   ?Recklessness; Overconfidence ?   ?Anxiety:    ?Worrying; Irritability ?  ?Psychosis:   ?None ?  ?Duration of Psychotic symptoms: No data recorded  ?Trauma:   ?N/A ?  ?Obsessions:   ?Cause anxiety; Good insight; Intrusive/time consuming; Recurrent & persistent thoughts/impulses/images ?  ?Compulsions:   ?None ?  ?Inattention:   ?None ?  ?Hyperactivity/Impulsivity:   ?None ?  ?Oppositional/Defiant Behaviors:   ?Argumentative; Temper ?  ?Emotional Irregularity:  No data recorded  ?Other Mood/Personality Symptoms:  No data recorded  ? ?Mental Status Exam ?Appearance and self-care  ?Stature:   ?Average ?  ?Weight:   ?Overweight ?  ?Clothing:   ?-- (Scrubs) ?  ?Grooming:   ?Normal ?  ?Cosmetic use:   ?None ?  ?Posture/gait:   ?Normal ?  ?Motor activity:   ?Not Remarkable ?  ?Sensorium  ?Attention:   ?Normal ?  ?Concentration:   ?Normal ?  ?Orientation:   ?Object; Person; Place; Situation ?  ?Recall/memory:   ?Normal ?  ?Affect and Mood  ?Affect:   ?Full Range; Tearful ?  ?Mood:   ?Anxious; Dysphoric ?  ?Relating  ?Eye contact:   ?Normal ?  ?Facial expression:   ?Responsive ?  ?Attitude toward examiner:   ?Cooperative ?  ?Thought and Language  ?Speech flow:  ?Clear and Coherent ?  ?Thought content:   ?Appropriate to Mood and Circumstances ?  ?Preoccupation:   ?None ?  ?Hallucinations:   ?  None ?  ?Organization:  No data recorded  ?Executive Functions  ?Fund of Knowledge:   ?Average ?  ?Intelligence:  No data recorded  ?Abstraction:   ?Normal ?  ?Judgement:   ?Impaired ?  ?Reality Testing:   ?Realistic ?  ?Insight:   ?Good ?  ?Decision Making:   ?Impulsive ?  ?Social Functioning  ?Social Maturity:   ?Impulsive ?  ?Social Judgement:   ?Impropriety ?  ?Stress  ?Stressors:   ?Family conflict; Relationship; Work ?  ?Coping Ability:   ?Exhausted ?  ?Skill Deficits:   ?Self-control ?  ?Supports:   ?Family; Friends/Service system ?  ? ? ?Religion: ?Religion/Spirituality ?Are You A Religious Person?:  (Not assessed) ? ?Leisure/Recreation: ?Leisure / Recreation ?Do  You Have Hobbies?: No ? ?Exercise/Diet: ?Exercise/Diet ?Do You Exercise?: No ?Have You Gained or Lost A Significant Amount of Weight in the Past Six Months?: No ?Do You Follow a Special Diet?: No ?Do You Have An

## 2021-06-01 NOTE — ED Notes (Signed)
The following items placed in one of one labeled bag: copper colored anklet, yellow metal ring, two yellow metal earrings with clear stones, one black bar earring, grey leggings, black t shirt, black sandals, black samsung galaxy phone with red and black case, tan bra, black and white panties ?

## 2021-06-01 NOTE — Progress Notes (Signed)
Client continues to be stable with no suicidal/homicidal ideations, psychosis, or withdrawal symptoms.  Recommended substance abuse assistance, client declined, information provided for [facility].  IVC resent. ? ?Nanine Means, pMHNP ?

## 2021-06-02 LAB — URINE CULTURE: Culture: <10000 — AB

## 2021-06-11 ENCOUNTER — Other Ambulatory Visit: Payer: Self-pay

## 2021-06-11 ENCOUNTER — Encounter: Payer: Self-pay | Admitting: Emergency Medicine

## 2021-06-11 DIAGNOSIS — O039 Complete or unspecified spontaneous abortion without complication: Secondary | ICD-10-CM | POA: Diagnosis not present

## 2021-06-11 DIAGNOSIS — Z3A08 8 weeks gestation of pregnancy: Secondary | ICD-10-CM | POA: Insufficient documentation

## 2021-06-11 DIAGNOSIS — O209 Hemorrhage in early pregnancy, unspecified: Secondary | ICD-10-CM | POA: Diagnosis present

## 2021-06-11 LAB — BASIC METABOLIC PANEL
Anion gap: 5 (ref 5–15)
BUN: 10 mg/dL (ref 6–20)
CO2: 25 mmol/L (ref 22–32)
Calcium: 8.9 mg/dL (ref 8.9–10.3)
Chloride: 106 mmol/L (ref 98–111)
Creatinine, Ser: 0.96 mg/dL (ref 0.44–1.00)
GFR, Estimated: >60 mL/min (ref >60)
Glucose, Bld: 98 mg/dL (ref 70–99)
Potassium: 4 mmol/L (ref 3.5–5.1)
Sodium: 136 mmol/L (ref 135–145)

## 2021-06-11 LAB — CBC
HCT: 39.5 % (ref 36.0–46.0)
Hemoglobin: 12.6 g/dL (ref 12.0–15.0)
MCH: 28.9 pg (ref 26.0–34.0)
MCHC: 31.9 g/dL (ref 30.0–36.0)
MCV: 90.6 fL (ref 80.0–100.0)
Platelets: 233 10*3/uL (ref 150–400)
RBC: 4.36 MIL/uL (ref 3.87–5.11)
RDW: 12.4 % (ref 11.5–15.5)
WBC: 6.9 10*3/uL (ref 4.0–10.5)
nRBC: 0 % (ref 0.0–0.2)

## 2021-06-11 LAB — HCG, QUANTITATIVE, PREGNANCY: hCG, Beta Chain, Quant, S: 456 m[IU]/mL — ABNORMAL HIGH (ref <5)

## 2021-06-11 LAB — ABO/RH: ABO/RH(D): O POS

## 2021-06-11 NOTE — ED Triage Notes (Signed)
Pt to ED from home c/o vaginal bleeding that started tonight that she noticed on toilet paper, bright red, denies clots or cramping.  G3P2, approx [redacted] weeks pregnant.  Hx of cesarean sections and fibroid tumors.  Pt A&Ox4, chest rise even and unlabored, skin WNL and in NAD at this time.

## 2021-06-12 ENCOUNTER — Emergency Department: Payer: Medicaid Other

## 2021-06-12 ENCOUNTER — Emergency Department
Admission: EM | Admit: 2021-06-12 | Discharge: 2021-06-12 | Disposition: A | Discharge status: Home / Self Care | Payer: Medicaid Other | Attending: Emergency Medicine | Admitting: Emergency Medicine

## 2021-06-12 DIAGNOSIS — O200 Threatened abortion: Secondary | ICD-10-CM

## 2021-06-12 NOTE — ED Notes (Signed)
Patient transported to [facility] 

## 2021-06-12 NOTE — ED Provider Notes (Signed)
   [facility] Provider Note    Event Date/Time   First MD Initiated Contact with Patient 06/12/21 0024     (approximate)  History   Chief Complaint: Vaginal Bleeding  HPI  Laura Benton is a 41 y.o. female with a past medical history of anemia, anxiety, gastric reflux, presents to the emergency department for vaginal bleeding.  According to the patient she is approximately [redacted] weeks pregnant, states earlier today around 3 PM she began experiencing vaginal bleeding.  States it has been a mild amount of bleeding.  Denies any prior miscarriages.  Physical Exam   Triage Vital Signs: ED Triage Vitals  Enc Vitals Group     BP 06/11/21 2221 (!) 152/96     Pulse Rate 06/11/21 2221 64     Resp 06/11/21 2221 16     Temp 06/11/21 2221 97.6 F (36.4 C)     Temp Source 06/11/21 2221 Oral     SpO2 06/11/21 2221 100 %     Weight 06/11/21 2217 240 lb (108.9 kg)     Height 06/11/21 2217 5\' 5"  (1.651 m)     Head Circumference --      Peak Flow --      Pain Score 06/11/21 2217 0     Pain Loc --      Pain Edu? --      Excl. in GC? --     Most recent vital signs: Vitals:   06/11/21 2221  BP: (!) 152/96  Pulse: 64  Resp: 16  Temp: 97.6 F (36.4 C)  SpO2: 100%    General: Awake, no distress.  CV:  Good peripheral perfusion.  Regular rate and rhythm  Resp:  Normal effort.  Equal breath sounds bilaterally.  Abd:  No distention.  Soft, nontender.  No rebound or guarding.    ED Results / Procedures / Treatments   RADIOLOGY  Ultrasound unable to find IUP.   MEDICATIONS ORDERED IN ED: Medications - No data to display   IMPRESSION / MDM / ASSESSMENT AND PLAN / ED COURSE  I reviewed the triage vital signs and the nursing notes.  Patient presents to the emergency department for vaginal bleeding approximately [redacted] weeks pregnant.  No abdominal pain.  No cramping.  G4, P3.  Patient's lab work today shows a very low beta-hCG 456.  Patient states she is  not entirely sure of her dates but believes them to be fairly accurate.  This would be a low pregnancy hormone level concerning for possible miscarriage.  The remainder the patient's lab work is reassuring.  Normal CBC.  Normal chemistry patient is O+ blood type does not require RhoGAM.  No IUP seen on ultrasound.  Discussed with the patient need to follow-up for 48-hour beta-hCG recheck however highly suspect miscarriage.  FINAL CLINICAL IMPRESSION(S) / ED DIAGNOSES   Threatened miscarriage    Note:  This document was prepared using Dragon voice recognition software and may include unintentional dictation errors.   2222, MD 06/12/21 [phone]

## 2021-06-12 NOTE — Discharge Instructions (Signed)
Please follow-up with your OB/GYN or doctor in 48 hours for a pregnancy hormone/beta-hCG recheck.  Today's levels 450.  Please return to the emergency department for any symptom personally concerning to yourself.

## 2021-06-28 DIAGNOSIS — K436 Other and unspecified ventral hernia with obstruction, without gangrene: Secondary | ICD-10-CM | POA: Insufficient documentation

## 2021-06-28 DIAGNOSIS — Z98891 History of uterine scar from previous surgery: Secondary | ICD-10-CM | POA: Insufficient documentation

## 2021-07-16 ENCOUNTER — Encounter: Payer: Self-pay | Admitting: Emergency Medicine

## 2021-07-16 ENCOUNTER — Emergency Department
Admission: EM | Admit: 2021-07-16 | Discharge: 2021-07-17 | Disposition: A | Discharge status: Home / Self Care | Payer: Medicaid Other | Attending: Student in an Organized Health Care Education/Training Program | Admitting: Student in an Organized Health Care Education/Training Program

## 2021-07-16 DIAGNOSIS — Z3A12 12 weeks gestation of pregnancy: Secondary | ICD-10-CM | POA: Diagnosis not present

## 2021-07-16 DIAGNOSIS — O209 Hemorrhage in early pregnancy, unspecified: Secondary | ICD-10-CM | POA: Insufficient documentation

## 2021-07-16 DIAGNOSIS — N939 Abnormal uterine and vaginal bleeding, unspecified: Secondary | ICD-10-CM

## 2021-07-16 DIAGNOSIS — N9489 Other specified conditions associated with female genital organs and menstrual cycle: Secondary | ICD-10-CM | POA: Insufficient documentation

## 2021-07-16 LAB — URINALYSIS, ROUTINE W REFLEX MICROSCOPIC
Bacteria, UA: NONE SEEN
Bilirubin Urine: NEGATIVE
Glucose, UA: NEGATIVE mg/dL
Ketones, ur: NEGATIVE mg/dL
Nitrite: NEGATIVE
Protein, ur: NEGATIVE mg/dL
Specific Gravity, Urine: 1.001 — ABNORMAL LOW (ref 1.005–1.030)
pH: 6 (ref 5.0–8.0)

## 2021-07-16 LAB — CBC WITH DIFFERENTIAL/PLATELET
Abs Immature Granulocytes: 0.05 10*3/uL (ref 0.00–0.07)
Basophils Absolute: 0.1 10*3/uL (ref 0.0–0.1)
Basophils Relative: 1 %
Eosinophils Absolute: 0.2 10*3/uL (ref 0.0–0.5)
Eosinophils Relative: 2 %
HCT: 40.7 % (ref 36.0–46.0)
Hemoglobin: 13.4 g/dL (ref 12.0–15.0)
Immature Granulocytes: 1 %
Lymphocytes Relative: 38 %
Lymphs Abs: 3.1 10*3/uL (ref 0.7–4.0)
MCH: 29.2 pg (ref 26.0–34.0)
MCHC: 32.9 g/dL (ref 30.0–36.0)
MCV: 88.7 fL (ref 80.0–100.0)
Monocytes Absolute: 0.5 10*3/uL (ref 0.1–1.0)
Monocytes Relative: 6 %
Neutro Abs: 4.3 10*3/uL (ref 1.7–7.7)
Neutrophils Relative %: 52 %
Platelets: 228 10*3/uL (ref 150–400)
RBC: 4.59 MIL/uL (ref 3.87–5.11)
RDW: 14 % (ref 11.5–15.5)
WBC: 8.2 10*3/uL (ref 4.0–10.5)
nRBC: 0 % (ref 0.0–0.2)

## 2021-07-16 LAB — COMPREHENSIVE METABOLIC PANEL
ALT: 17 U/L (ref 0–44)
AST: 26 U/L (ref 15–41)
Albumin: 3.8 g/dL (ref 3.5–5.0)
Alkaline Phosphatase: 48 U/L (ref 38–126)
Anion gap: 10 (ref 5–15)
BUN: 15 mg/dL (ref 6–20)
CO2: 22 mmol/L (ref 22–32)
Calcium: 9.7 mg/dL (ref 8.9–10.3)
Chloride: 103 mmol/L (ref 98–111)
Creatinine, Ser: 0.98 mg/dL (ref 0.44–1.00)
GFR, Estimated: >60 mL/min (ref >60)
Glucose, Bld: 106 mg/dL — ABNORMAL HIGH (ref 70–99)
Potassium: 4.2 mmol/L (ref 3.5–5.1)
Sodium: 135 mmol/L (ref 135–145)
Total Bilirubin: 0.5 mg/dL (ref 0.3–1.2)
Total Protein: 7.5 g/dL (ref 6.5–8.1)

## 2021-07-16 LAB — HCG, QUANTITATIVE, PREGNANCY: hCG, Beta Chain, Quant, S: <1 m[IU]/mL (ref <5)

## 2021-07-16 LAB — POC URINE PREG, ED: Preg Test, Ur: NEGATIVE

## 2021-07-16 LAB — ABO/RH: ABO/RH(D): O POS

## 2021-07-16 NOTE — ED Provider Notes (Signed)
[facility] Provider Note    Event Date/Time   First MD Initiated Contact with Patient 07/16/21 2133     (approximate)   History   Vaginal Bleeding   HPI  Laura Benton is a 41 y.o. female  G3P1001 thinks that she is roughly [redacted] weeks pregnant having been seen in roughly 1 month ago vaginal bleeding in early pregnancy diagnosed with threatened miscarriage presents to the ER for spotting again today no specific pain or discomfort.  Denies any fevers or chills.  Has not followed up or seen OB/GYN.  Does have a history of uterine fibroids.        Physical Exam   Triage Vital Signs: ED Triage Vitals  Enc Vitals Group     BP 07/16/21 1914 (!) 146/99     Pulse Rate 07/16/21 1914 88     Resp 07/16/21 1914 20     Temp 07/16/21 1914 98.7 F (37.1 C)     Temp Source 07/16/21 1914 Oral     SpO2 07/16/21 1914 98 %     Weight 07/16/21 1913 245 lb (111.1 kg)     Height 07/16/21 1913 5\' 5"  (1.651 m)     Head Circumference --      Peak Flow --      Pain Score 07/16/21 1913 5     Pain Loc --      Pain Edu? --      Excl. in GC? --     Most recent vital signs: Vitals:   07/16/21 1914 07/16/21 2215  BP: (!) 146/99 (!) 146/101  Pulse: 88 100  Resp: 20 16  Temp: 98.7 F (37.1 C)   SpO2: 98% 97%     Constitutional: Alert  Eyes: Conjunctivae are normal.  Head: Atraumatic. Nose: No congestion/rhinnorhea. Mouth/Throat: Mucous membranes are moist.   Neck: Painless ROM.  Cardiovascular:   Good peripheral circulation. Respiratory: Normal respiratory effort.  No retractions.  Gastrointestinal: Soft and nontender in all four quadrants Musculoskeletal:  no deformity Neurologic:  MAE spontaneously. No gross focal neurologic deficits are appreciated.  Skin:  Skin is warm, dry and intact. No rash noted. Psychiatric: Mood and affect are normal. Speech and behavior are normal.    ED Results / Procedures / Treatments   Labs (all labs ordered are  listed, but only abnormal results are displayed) Labs Reviewed  COMPREHENSIVE METABOLIC PANEL - Abnormal; Notable for the following components:      Result Value   Glucose, Bld 106 (*)    All other components within normal limits  URINALYSIS, ROUTINE W REFLEX MICROSCOPIC - Abnormal; Notable for the following components:   Color, Urine COLORLESS (*)    APPearance CLEAR (*)    Specific Gravity, Urine 1.001 (*)    Hgb urine dipstick MODERATE (*)    Leukocytes,Ua TRACE (*)    All other components within normal limits  HCG, QUANTITATIVE, PREGNANCY  CBC WITH DIFFERENTIAL/PLATELET  POC URINE PREG, ED  ABO/RH     EKG     RADIOLOGY Please see ED Course for my review and interpretation.  I personally reviewed all radiographic images ordered to evaluate for the above acute complaints and reviewed radiology reports and findings.  These findings were personally discussed with the patient.  Please see medical record for radiology report.    PROCEDURES:  Critical Care performed:   Procedures   MEDICATIONS ORDERED IN ED: Medications - No data to display   IMPRESSION / MDM / ASSESSMENT AND  PLAN / ED COURSE  I reviewed the triage vital signs and the nursing notes.                              Differential diagnosis includes, but is not limited to, ectopic, retain poc, dub, aub, miscarriage  Patient presenting for vaginal spotting in the setting of presumed pregnancy but blood work showing that she is not pregnant making her previous visit more consistent with miscarriage.  Hemoglobin stable.  Given abnormal bleeding at this point she does not have PCP will order ultrasound to rule out fibroid or other acute pelvic etiology if normal will be appropriate for outpatient follow-up.  Patient agreeable to plan.  Patient signed out to oncoming physician pending follow-up ultrasound if negative will be given referral to OB/GYN    Patient's presentation is most consistent with acute  complicated illness / injury requiring diagnostic workup.   FINAL CLINICAL IMPRESSION(S) / ED DIAGNOSES   Final diagnoses:  Vaginal bleeding     Rx / DC Orders   ED Discharge Orders     None        Note:  This document was prepared using Dragon voice recognition software and may include unintentional dictation errors.    Willy Eddy, MD 07/16/21 2240

## 2021-07-16 NOTE — ED Triage Notes (Signed)
Pt presents via POV with complaints of vaginal bleeding that started this AM. Pt states she is pregnant with her third child but is unsure how far along she is because she "hasn't kept up with it". Pt believes she is between 12-16 weeks. She endorses lower abdominal pain with mild nausea. Denies CP or SOB.

## 2021-07-17 ENCOUNTER — Emergency Department: Payer: Medicaid Other

## 2021-07-17 ENCOUNTER — Encounter: Payer: Self-pay | Admitting: Radiology

## 2021-08-04 ENCOUNTER — Ambulatory Visit: Payer: Medicaid Other

## 2022-02-11 ENCOUNTER — Other Ambulatory Visit: Payer: Self-pay

## 2022-02-11 ENCOUNTER — Emergency Department
Admission: EM | Admit: 2022-02-11 | Discharge: 2022-02-11 | Disposition: A | Discharge status: Home / Self Care | Payer: Medicaid Other | Attending: Emergency Medicine | Admitting: Emergency Medicine

## 2022-02-11 DIAGNOSIS — I10 Essential (primary) hypertension: Secondary | ICD-10-CM | POA: Insufficient documentation

## 2022-02-11 DIAGNOSIS — R0789 Other chest pain: Secondary | ICD-10-CM | POA: Insufficient documentation

## 2022-02-11 LAB — COMPREHENSIVE METABOLIC PANEL
ALT: 24 U/L (ref 0–44)
AST: 39 U/L (ref 15–41)
Albumin: 3.8 g/dL (ref 3.5–5.0)
Alkaline Phosphatase: 50 U/L (ref 38–126)
Anion gap: 10 (ref 5–15)
BUN: 14 mg/dL (ref 6–20)
CO2: 26 mmol/L (ref 22–32)
Calcium: 10 mg/dL (ref 8.9–10.3)
Chloride: 102 mmol/L (ref 98–111)
Creatinine, Ser: 0.96 mg/dL (ref 0.44–1.00)
GFR, Estimated: >60 mL/min (ref >60)
Glucose, Bld: 96 mg/dL (ref 70–99)
Potassium: 4.1 mmol/L (ref 3.5–5.1)
Sodium: 138 mmol/L (ref 135–145)
Total Bilirubin: 0.6 mg/dL (ref 0.3–1.2)
Total Protein: 7.7 g/dL (ref 6.5–8.1)

## 2022-02-11 LAB — CBC
HCT: 42.5 % (ref 36.0–46.0)
Hemoglobin: 13.8 g/dL (ref 12.0–15.0)
MCH: 28.9 pg (ref 26.0–34.0)
MCHC: 32.5 g/dL (ref 30.0–36.0)
MCV: 88.9 fL (ref 80.0–100.0)
Platelets: 241 10*3/uL (ref 150–400)
RBC: 4.78 MIL/uL (ref 3.87–5.11)
RDW: 14.3 % (ref 11.5–15.5)
WBC: 5 10*3/uL (ref 4.0–10.5)
nRBC: 0 % (ref 0.0–0.2)

## 2022-02-11 LAB — TROPONIN I (HIGH SENSITIVITY): Troponin I (High Sensitivity): 6 ng/L (ref <18)

## 2022-02-11 NOTE — ED Provider Notes (Signed)
[facility] Provider Note    Event Date/Time   First MD Initiated Contact with Patient 02/11/22 1051     (approximate)   History   Chest pain   HPI  Laura Benton is a 42 y.o. female with history of high blood pressure who presents today with complaints of brief episode of chest discomfort and dizziness this morning, now resolved.  She is concerned because her blood pressure has been high.  She reports her PCP started her on blood pressure medication 2 months ago which she is not sure what it is called.  No shortness of breath, no pleurisy.  No neurodeficits     Physical Exam   Triage Vital Signs: ED Triage Vitals  Enc Vitals Group     BP 02/11/22 1042 (!) 170/108     Pulse Rate 02/11/22 1042 79     Resp 02/11/22 1042 18     Temp 02/11/22 1042 98 F (36.7 C)     Temp src --      SpO2 02/11/22 1042 100 %     Weight --      Height --      Head Circumference --      Peak Flow --      Pain Score 02/11/22 1040 2     Pain Loc --      Pain Edu? --      Excl. in [place]? --     Most recent vital signs: Vitals:   02/11/22 1200 02/11/22 1225  BP: (!) 155/105   Pulse: 76 82  Resp:    Temp:    SpO2: 97% 96%     General: Awake, no distress.  CV:  Good peripheral perfusion.  Regular rate and rhythm Resp:  Normal effort.  Clear to auscultation bilaterally Abd:  No distention.  Other:  No calf pain or swelling   ED Results / Procedures / Treatments   Labs (all labs ordered are listed, but only abnormal results are displayed) Labs Reviewed  CBC  COMPREHENSIVE METABOLIC PANEL  TROPONIN I (HIGH SENSITIVITY)     EKG  ED ECG REPORT I, Lavonia Drafts, the attending physician, personally viewed and interpreted this ECG.  Date: 02/11/2022  Rhythm: normal sinus rhythm QRS Axis: normal Intervals: normal ST/T Wave abnormalities: normal Narrative Interpretation: no evidence of acute  ischemia    RADIOLOGY     PROCEDURES:  Critical Care performed:   Procedures   MEDICATIONS ORDERED IN ED: Medications - No data to display   IMPRESSION / MDM / [place] / ED COURSE  I reviewed the triage vital signs and the nursing notes. Patient's presentation is most consistent with acute presentation with potential threat to life or bodily function.  Patient presents with complaints of episode of chest pain and dizziness, now resolved, she describes it as a brief sharp sensation.  She is concerned about her blood pressure primarily.  She does have a history of high blood pressure.  Will obtain EKG, labs, differential includes ACS although unlikely, hypertension, electrolyte abnormality.    Will ask pharmacy tech to determine what her blood pressure medication is  Pharmacy has reconciled her medications, patient already has an increased dose of losartan prescribed for her at the pharmacy  Work reviewed and is reassuring, high sensitive troponin is normal, CMP CBC are normal.  Patient is reassured by this, no indication for admission at this time, appropriate for discharge with close outpatient follow-up, return precautions discussed  FINAL CLINICAL IMPRESSION(S) / ED DIAGNOSES   Final diagnoses:  Atypical chest pain  Primary hypertension     Rx / DC Orders   ED Discharge Orders     None        Note:  This document was prepared using Dragon voice recognition software and may include unintentional dictation errors.   Lavonia Drafts, MD 02/11/22 1229

## 2022-02-11 NOTE — Discharge Instructions (Addendum)
Please pick up your losartan dose which has been increased at the pharmacy

## 2022-02-11 NOTE — ED Triage Notes (Addendum)
Pt comes with c/o HTN. Pt states she is on meds and PCP is suppose to start pt on higher dose. Pt currently takes levaprin or something like that per pt. Pt normally takes 1x daily. Pt states she took 2 pills this am.

## 2022-02-11 NOTE — ED Notes (Signed)
Pt given 2 warm blankets as requested. 

## 2022-02-11 NOTE — ED Triage Notes (Signed)
First Nurse Note:  Arrives c/o feeling dizzy this morning.  Concerned that BP is elevated.  AAOx3. Skin warm and dry. NAD

## 2022-02-11 NOTE — ED Notes (Addendum)
See triage note. Pt reports PCP trying to edit BP med since having HTN and headaches lately. Pt denies CP/SOB today but does state mild HA's over last few days. Pt in NAD; skin dry, resp reg/unlabored, resting calmly on stretcher, speech clear, denies weakness or issues with balance.

## 2022-03-18 DIAGNOSIS — R87619 Unspecified abnormal cytological findings in specimens from cervix uteri: Secondary | ICD-10-CM | POA: Insufficient documentation

## 2022-06-08 ENCOUNTER — Ambulatory Visit: Payer: Medicaid Other | Admitting: Family Medicine

## 2022-10-14 IMAGING — CT CT HEAD W/O CM
3 series · 16 of 47 positions shown, 19 images · non-contrast
Comparison: None.

CLINICAL DATA: Left lateral scalp injury, laceration, loss of
consciousness

EXAM:
CT HEAD WITHOUT CONTRAST
TECHNIQUE: Contiguous axial images were obtained from the base of the skull
through the vertex without intravenous contrast.

[Series 2: head wo · axial · 0.41mm/px · z∈[-87,+43]mm · 10 of 32 slices shown, 13 images]
[im 3/32  brain]
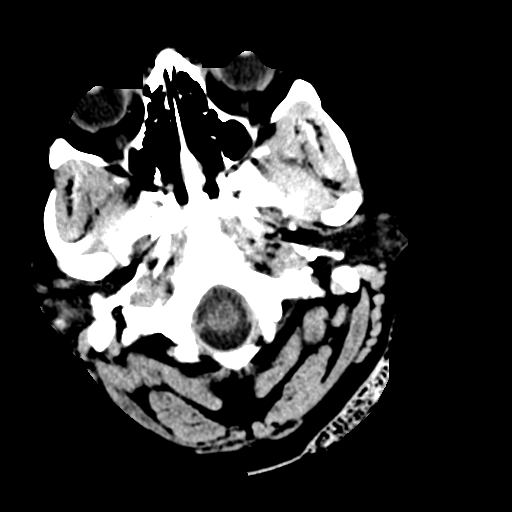
[im 3/32  bone]
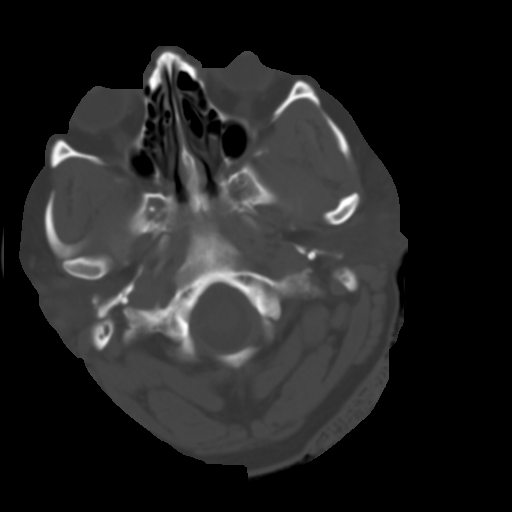
[im 6/32  brain]
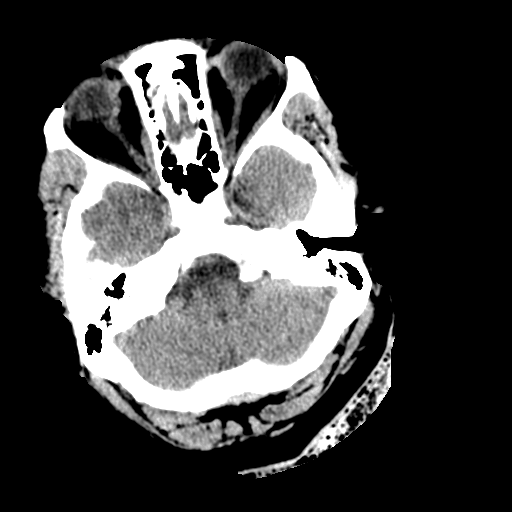
[im 9/32  brain]
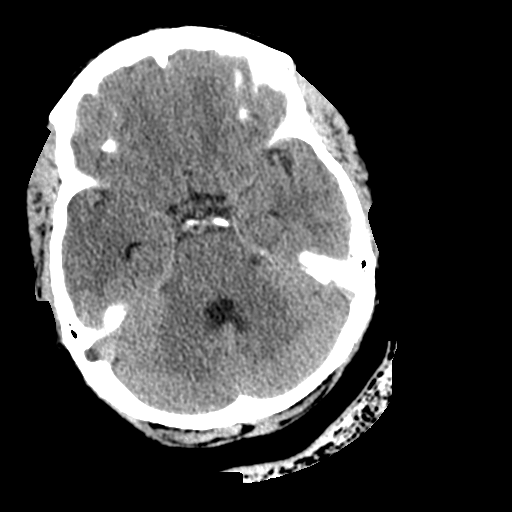
[im 11/32  brain]
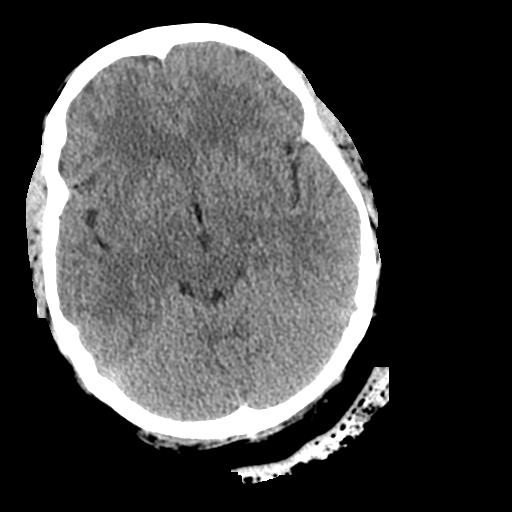
[im 14/32  brain]
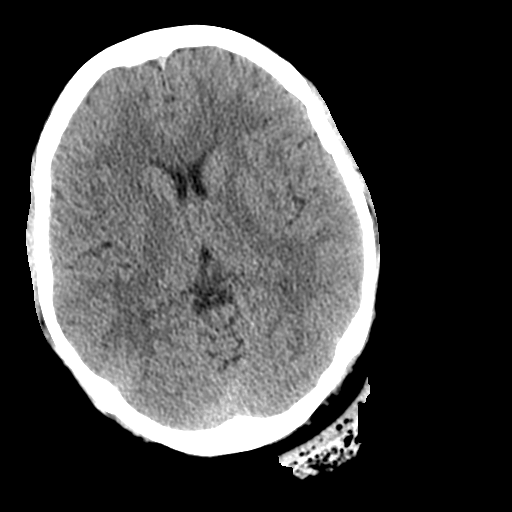
[im 14/32  bone]
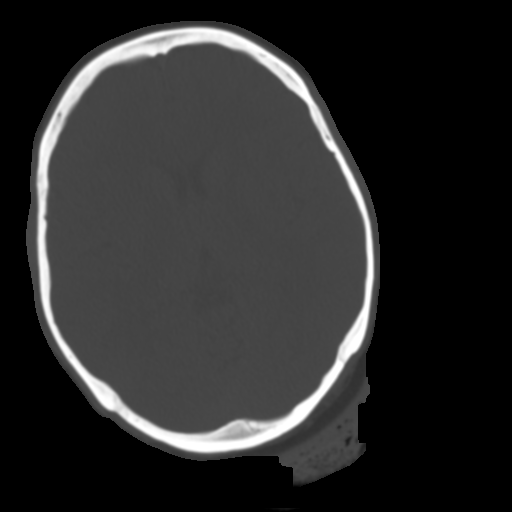
[im 18/32  brain]
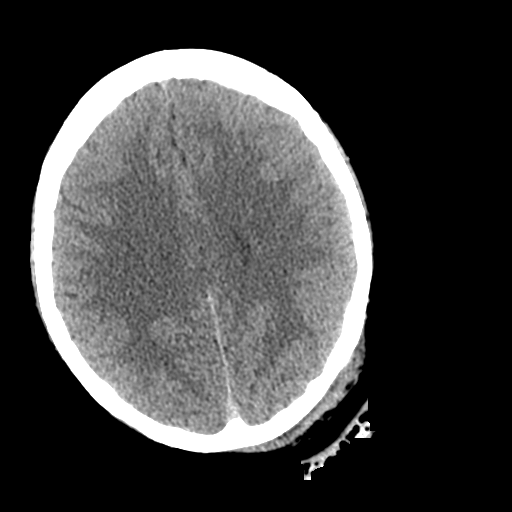
[im 21/32  brain]
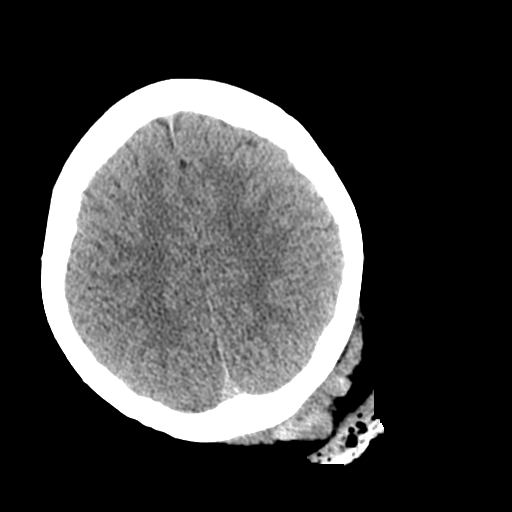
[im 24/32  brain]
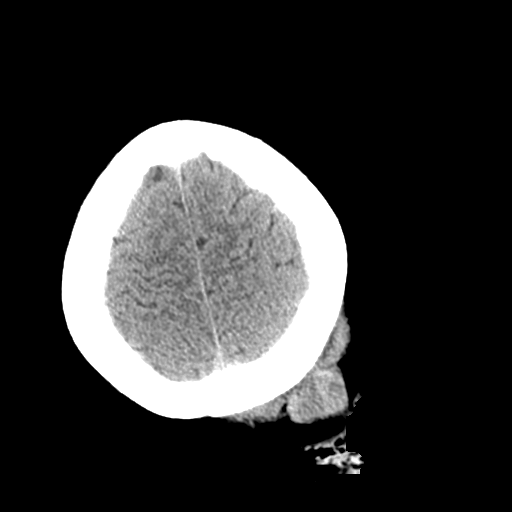
[im 26/32  brain]
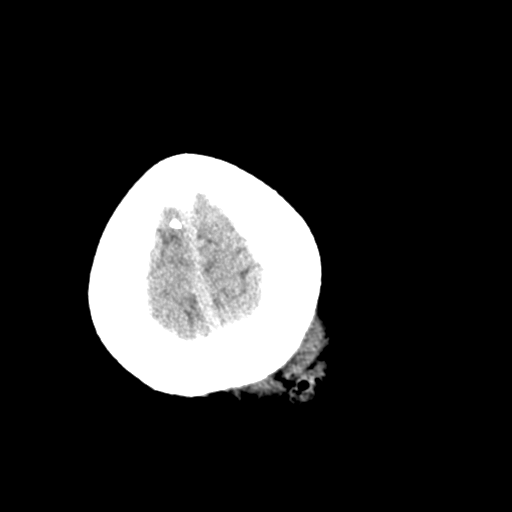
[im 26/32  bone]
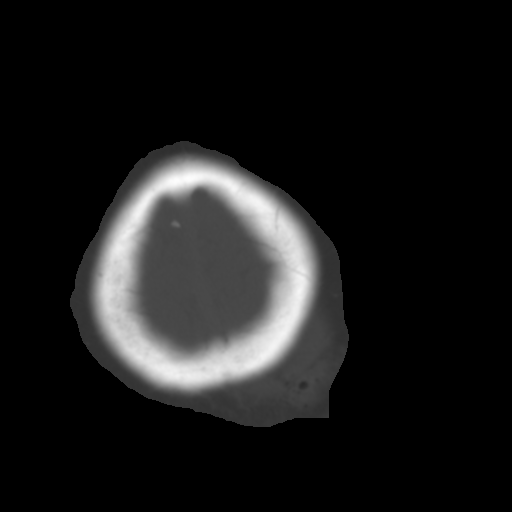
[im 29/32  brain]
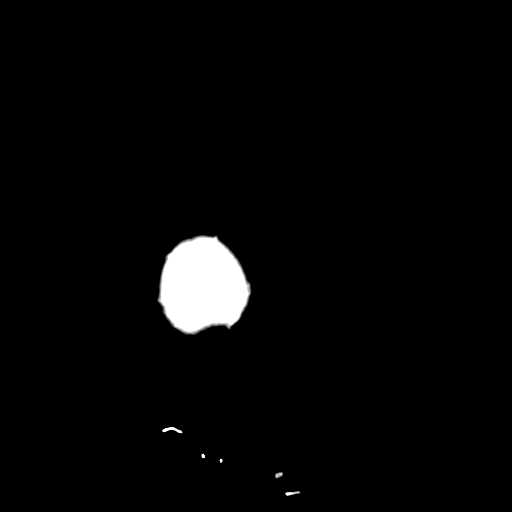

[Series 4: coronal soft tissue · coronal · 0.34mm/px · 3 of 70 slices shown]
[im 24/70  brain]
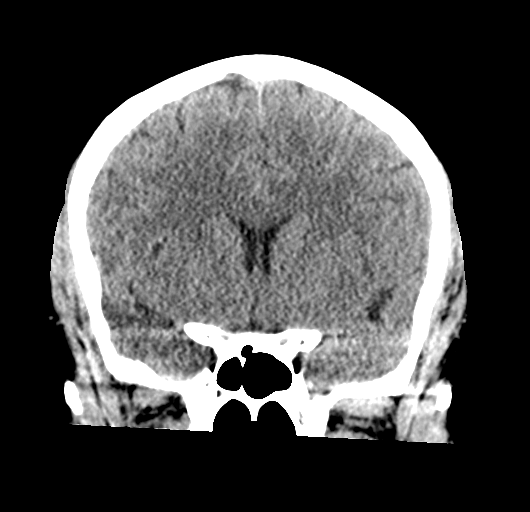
[im 31/70  brain]
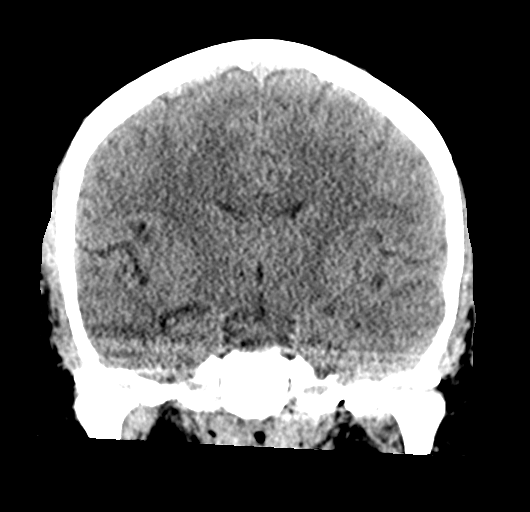
[im 39/70  brain]
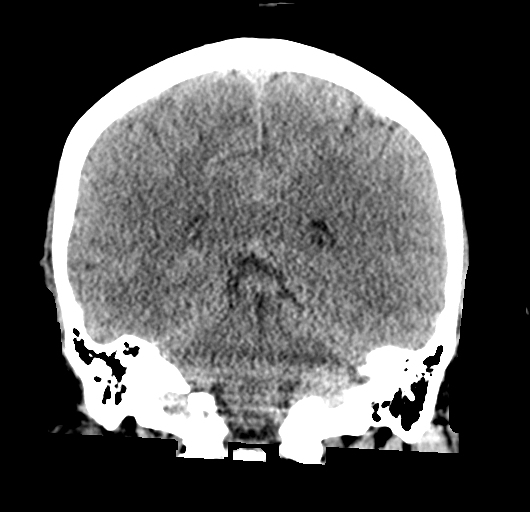

[Series 5: sagittal soft tissue · sagittal · 0.34mm/px · 3 of 60 slices shown]
[im 20/60  brain]
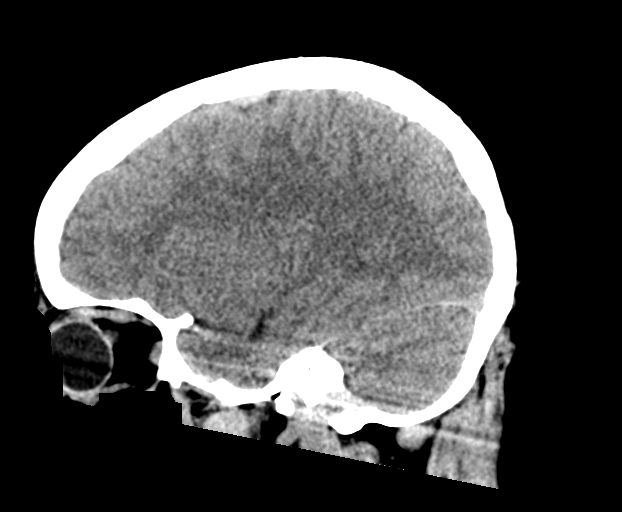
[im 30/60  brain]
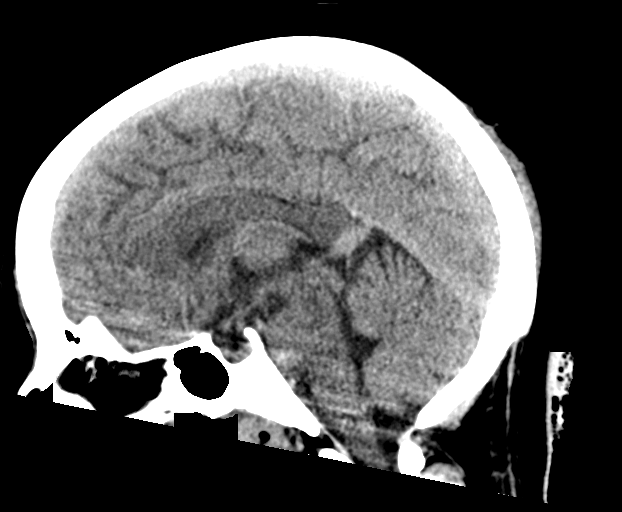
[im 40/60  brain]
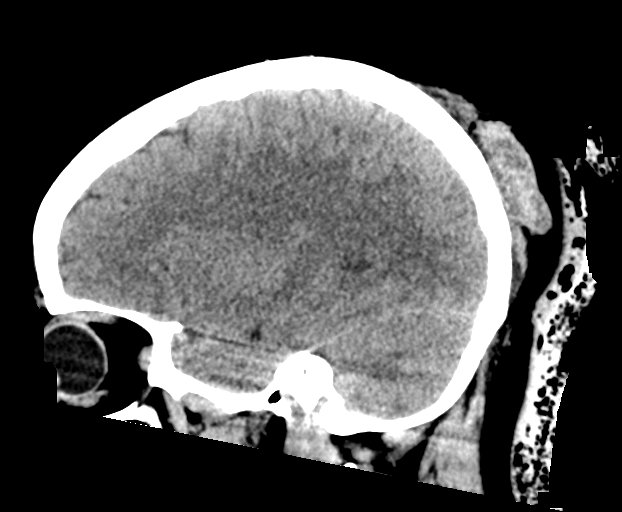

[16 of 47 positions shown; findings below may reference images not displayed]

FINDINGS: Brain: No acute infarct or hemorrhage. Lateral ventricles and
midline structures are unremarkable. No acute extra-axial fluid
collections. No mass effect.

Vascular: No hyperdense vessel or unexpected calcification.

Skull: Large left parietooccipital scalp hematoma and laceration. No
underlying fracture or radiopaque foreign body. The remainder of the
calvarium is unremarkable.

Sinuses/Orbits: No acute finding.

Other: None.
IMPRESSION: 1. Large left parieto-occipital scalp hematoma and laceration. No
underlying fracture.
2. No acute intracranial process.

## 2022-11-14 ENCOUNTER — Emergency Department
Admission: EM | Admit: 2022-11-14 | Discharge: 2022-11-15 | Disposition: A | Discharge status: Home / Self Care | Payer: Medicaid Other | Attending: Emergency Medicine | Admitting: Emergency Medicine

## 2022-11-14 ENCOUNTER — Other Ambulatory Visit: Payer: Self-pay

## 2022-11-14 ENCOUNTER — Emergency Department: Payer: Medicaid Other

## 2022-11-14 DIAGNOSIS — M549 Dorsalgia, unspecified: Secondary | ICD-10-CM | POA: Insufficient documentation

## 2022-11-14 DIAGNOSIS — U071 COVID-19: Secondary | ICD-10-CM | POA: Insufficient documentation

## 2022-11-14 DIAGNOSIS — R0981 Nasal congestion: Secondary | ICD-10-CM | POA: Diagnosis present

## 2022-11-14 LAB — RESP PANEL BY RT-PCR (RSV, FLU A&B, COVID)  RVPGX2
Influenza A by PCR: NEGATIVE
Influenza B by PCR: NEGATIVE
Resp Syncytial Virus by PCR: NEGATIVE
SARS Coronavirus 2 by RT PCR: POSITIVE — AB

## 2022-11-14 MED ORDER — ACETAMINOPHEN 500 MG PO TABS
1000.0000 mg | ORAL_TABLET | Freq: Once | ORAL | Status: AC
Start: 2022-11-14 — End: 2022-11-14
  Administered 2022-11-14: 1000 mg via ORAL
  Filled 2022-11-14: qty 2

## 2022-11-14 NOTE — ED Triage Notes (Signed)
Flu-like symtoms (nasal congestion, back pain, cough, reported fever)

## 2022-11-14 NOTE — Discharge Instructions (Addendum)
Please isolate for 5 days from the start of your symptoms. After this if you have no symptoms or your symptoms are improving, you can return to work as long as you have not had a fever for 24 hours.   Take cold medication to manage your symptoms of cough and congestion

## 2022-11-14 NOTE — ED Provider Notes (Signed)
[facility] Provider Note    Event Date/Time   First MD Initiated Contact with Patient 11/14/22 2117     (approximate)   History   Nasal Congestion   HPI  Laura Benton is a 42 y.o. female who presents for evaluation of nasal congestion and fever that began earlier today.  Patient also endorses some cough and back pain.     Physical Exam   Triage Vital Signs: ED Triage Vitals  Encounter Vitals Group     BP 11/14/22 1910 (!) 147/85     Systolic BP Percentile --      Diastolic BP Percentile --      Pulse Rate 11/14/22 1910 (!) 124     Resp 11/14/22 1910 18     Temp 11/14/22 1910 (!) 102.4 F (39.1 C)     Temp Source 11/14/22 1910 Oral     SpO2 11/14/22 1910 97 %     Weight 11/14/22 1915 245 lb (111.1 kg)     Height 11/14/22 1915 5\' 5"  (1.651 m)     Head Circumference --      Peak Flow --      Pain Score 11/14/22 1915 1     Pain Loc --      Pain Education --      Exclude from Growth Chart --     Most recent vital signs: Vitals:   11/14/22 1910  BP: (!) 147/85  Pulse: (!) 124  Resp: 18  Temp: (!) 102.4 F (39.1 C)  SpO2: 97%    General: Somnolent, mild distress, uncomfortable. CV:  Good peripheral perfusion.  RRR. Resp:  Normal effort.  CTAB. Abd:  No distention.  Other:     ED Results / Procedures / Treatments   Labs (all labs ordered are listed, but only abnormal results are displayed) Labs Reviewed  RESP PANEL BY RT-PCR (RSV, FLU A&B, COVID)  [id number] - Abnormal; Notable for the following components:      Result Value   SARS Coronavirus 2 by RT PCR POSITIVE (*)    All other components within normal limits     EKG  ED provider interpretation: EKG shows sinus tachycardia with no ST changes.   RADIOLOGY  Chest x-ray obtained, interpreted the images as well as reviewed the radiologist report which was negative for any acute cardiopulmonary abnormalities.   PROCEDURES:  Critical Care performed:  No  Procedures   MEDICATIONS ORDERED IN ED: Medications  acetaminophen (TYLENOL) tablet 1,000 mg (1,000 mg Oral Given 11/14/22 1919)     IMPRESSION / MDM / ASSESSMENT AND PLAN / ED COURSE  I reviewed the triage vital signs and the nursing notes.                             42 year old female presents for evaluation of nasal congestion and fever.  Patient was hypertensive, tachycardic and febrile in triage.  Patient in mild distress on exam due to discomfort.  Differential diagnosis includes, but is not limited to, COVID, flu, viral URI, RSV.  Patient's presentation is most consistent with acute complicated illness / injury requiring diagnostic workup.  Patient's respiratory panel was positive for COVID.  Chest x-ray reviewed and interpreted by me, no acute cardiopulmonary abnormalities.  Patient did state she had some chest pain upon reassessment.  I talked about doing a full cardiac workup with the patient which would involve blood work and would prolong her stay.  Patient did not want to stay for blood work but was agreeable to have an EKG which showed sinus tachycardia. I feel that her chest pain is best attributed to her illness rather than CAD. I did explain to patient that I cannot definitively rule out MI without blood work. She was willing to accept this risk and will return with any new or worsening symptoms.  Given that patient has a positive COVID test I feel this is the explanation for her tachycardia and fever, thus full septic workup was not completed.  Patient was advised on symptomatic management using over-the-counter cold medication, Tylenol and ibuprofen.  She will be given a note for work.  She voiced understanding, all questions were answered and she was stable at discharge.      FINAL CLINICAL IMPRESSION(S) / ED DIAGNOSES   Final diagnoses:  COVID     Rx / DC Orders   ED Discharge Orders     None        Note:  This document was prepared using  Dragon voice recognition software and may include unintentional dictation errors.   Cameron Ali, PA-C 11/14/22 2310    Chesley Noon, MD 11/14/22 [phone]

## 2022-11-14 NOTE — ED Provider Triage Note (Signed)
Emergency Medicine Provider Triage Evaluation Note  Laura Benton , a 42 y.o. female  was evaluated in triage.  Pt complains of fever and weakness, she has headache and nasal drainage.   Review of Systems  Positive: Congestion, fever, weakness, CP Negative: sob  Physical Exam  There were no vitals taken for this visit. Gen:   Awake, no distress   Resp:  Normal effort  MSK:   Moves extremities without difficulty  Other:    Medical Decision Making  Medically screening exam initiated at 7:09 PM.  Appropriate orders placed.  Laura Benton was informed that the remainder of the evaluation will be completed by another provider, this initial triage assessment does not replace that evaluation, and the importance of remaining in the ED until their evaluation is complete.     Cameron Ali, PA-C 11/14/22 1910

## 2023-02-11 ENCOUNTER — Encounter: Payer: Self-pay | Admitting: Internal Medicine

## 2023-02-14 ENCOUNTER — Other Ambulatory Visit: Payer: Self-pay | Admitting: Internal Medicine

## 2023-02-14 DIAGNOSIS — Z8719 Personal history of other diseases of the digestive system: Secondary | ICD-10-CM

## 2023-02-14 DIAGNOSIS — R1030 Lower abdominal pain, unspecified: Secondary | ICD-10-CM

## 2023-02-18 ENCOUNTER — Ambulatory Visit: Admission: RE | Admit: 2023-02-18 | Payer: Medicaid Other | Source: Ambulatory Visit

## 2023-02-21 ENCOUNTER — Telehealth: Payer: Self-pay

## 2023-02-22 ENCOUNTER — Ambulatory Visit: Payer: Medicaid Other | Admitting: Surgery

## 2023-02-22 ENCOUNTER — Other Ambulatory Visit: Payer: Self-pay

## 2023-02-22 DIAGNOSIS — K432 Incisional hernia without obstruction or gangrene: Secondary | ICD-10-CM

## 2023-02-22 NOTE — Telephone Encounter (Signed)
CT has been scheduled

## 2023-02-28 ENCOUNTER — Ambulatory Visit
Admission: RE | Admit: 2023-02-28 | Discharge: 2023-02-28 | Disposition: A | Discharge status: Home / Self Care | Payer: Medicaid Other | Source: Ambulatory Visit | Attending: Surgery | Admitting: Surgery

## 2023-02-28 DIAGNOSIS — K432 Incisional hernia without obstruction or gangrene: Secondary | ICD-10-CM | POA: Diagnosis present

## 2023-02-28 HISTORY — DX: Essential (primary) hypertension: I10

## 2023-02-28 MED ORDER — IOHEXOL 300 MG/ML  SOLN
100.0000 mL | Freq: Once | INTRAMUSCULAR | Status: AC | PRN
Start: 2023-02-28 — End: 2023-02-28
  Administered 2023-02-28: 100 mL via INTRAVENOUS

## 2023-03-03 ENCOUNTER — Ambulatory Visit: Payer: Medicaid Other | Admitting: Surgery

## 2023-03-03 ENCOUNTER — Encounter: Payer: Self-pay | Admitting: Surgery

## 2023-03-03 VITALS — BP 123/77 | HR 79 | Temp 98.6°F | Ht 65.0 in | Wt 231.2 lb

## 2023-03-03 DIAGNOSIS — Z8719 Personal history of other diseases of the digestive system: Secondary | ICD-10-CM | POA: Insufficient documentation

## 2023-03-03 DIAGNOSIS — L7634 Postprocedural seroma of skin and subcutaneous tissue following other procedure: Secondary | ICD-10-CM | POA: Diagnosis not present

## 2023-03-03 DIAGNOSIS — Z9889 Other specified postprocedural states: Secondary | ICD-10-CM

## 2023-03-03 NOTE — Progress Notes (Signed)
 Patient ID: Laura Benton, female   DOB: 1980/09/13, 43 y.o.   MRN: [id number]  Chief Complaint: Brief history of right lower abdominal pain.  History of Present Illness  She had some discomfort in her right lower quadrant area when she made the appointment, resolved since.  Had follow-up CT scan which showed minimal residual seroma/scarring in the area.  Radiologist report still pending, as of today. Prior mesh repair completed laparoscopically here at [facility]. Wants to know if it is okay if she lifts 40 pounds at work.  She has been avoiding it since her hernia repair.  I reassured her that her hernia looks intact on CT scan, and continued weight loss will be helpful at diminishing the strain/stress on her mesh repair.  She reports she is lost some weight.  14 pounds from last fall.   Past Medical History Past Medical History:  Diagnosis Date   Anemia    Anxiety    no meds   Complication of anesthesia    delayed emergence   Difficult intubation    Elevated blood pressure reading    GERD (gastroesophageal reflux disease)    no meds   Head trauma 05/2020   staple in scalp to repair laceration   Heart murmur    early 20's   History of 2019 novel coronavirus disease (COVID-19) 01/2020   Hypertension    Incisional hernia, without obstruction or gangrene 03/31/2018   Vaginal Pap smear, abnormal       Past Surgical History:  Procedure Laterality Date   CESAREAN SECTION     x2.  2000, 2019   INCISION AND DRAINAGE ABSCESS N/A 07/16/2020   Procedure: drain placement, drainage of post operative seroma;  Surgeon: Lane Shope, MD;  Location: [facility] ORS;  Service: General;  Laterality: N/A;   INCISIONAL HERNIA REPAIR N/A 07/12/2018   Procedure: LAPAROSCOPIC INCISIONAL ABDOMINAL WALL HERNIA REPAIR;  Surgeon: Nicholaus Selinda Birmingham, MD;  Location: [facility] OR[facility];  Service: General;  Laterality: N/A;   right shoulder surgery Right    tear of rotator cuff.   TONSILLECTOMY     UTERINE  FIBROID SURGERY      No Known Allergies  Current Outpatient Medications  Medication Sig Dispense Refill   hydrochlorothiazide  (HYDRODIURIL ) 25 MG tablet Take 25 mg by mouth daily.     losartan (COZAAR) 50 MG tablet Take 50 mg by mouth daily.     No current facility-administered medications for this visit.    Family History Family History  Problem Relation Age of Onset   Throat cancer Father    Anemia Sister    Pulmonary disease Son       Social History Social History   Tobacco Use   Smoking status: Some Days    Types: Cigars   Smokeless tobacco: Never   Tobacco comments:    black and milds  Vaping Use   Vaping status: Never Used  Substance Use Topics   Alcohol use: Yes    Comment: beer/liqour qd   Drug use: Never        Review of Systems  Constitutional: Negative.   HENT: Negative.    Eyes: Negative.   Respiratory: Negative.    Cardiovascular: Negative.   Gastrointestinal:  Positive for abdominal pain. Negative for blood in stool, diarrhea, heartburn, melena, nausea and vomiting.  Genitourinary: Negative.   Skin: Negative.   Neurological: Negative.   Psychiatric/Behavioral: Negative.        Physical Exam Blood pressure 123/77, pulse 79, temperature 98.6 F (37  C), temperature source Oral, height 5' 5 (1.651 m), weight 231 lb 3.2 oz (104.9 kg), last menstrual period 02/28/2023, SpO2 98%, unknown if currently breastfeeding. Last Weight  Most recent update: 03/03/2023  2:11 PM    Weight  104.9 kg (231 lb 3.2 oz)              CONSTITUTIONAL: Well developed, and nourished, appropriately responsive and aware without distress.  Obese.   EYES: Sclera non-icteric.   EARS, NOSE, MOUTH AND THROAT: Mask worn.     Hearing is intact to voice.  NECK: Trachea is midline, and there is no jugular venous distension.  LYMPH NODES:  Lymph nodes in the neck are not enlarged. RESPIRATORY:  Lungs are clear, and breath sounds are equal bilaterally. Normal respiratory  effort without pathologic use of accessory muscles. CARDIOVASCULAR: Heart is regular in rate and rhythm. GI: Well established previously appreciated right lower quadrant abdominal wall mass is not appreciable, she perceives some asymmetry that is still present.    We reviewed her current CT imaging, confirming that this is consistent with a tiny residual postoperative seroma.  Otherwise the abdomen is soft, nontender, and nondistended. There were no other palpable masses. MUSCULOSKELETAL:  Symmetrical muscle tone appreciated in all four extremities.    SKIN: Skin turgor is normal. No pathologic skin lesions appreciated.  NEUROLOGIC:  Motor and sensation appear grossly normal.  Cranial nerves are grossly without defect. PSYCH:  Alert and oriented to person, place and time. Affect is appropriate for situation.  Data Reviewed I have personally reviewed what is currently available of the patient's imaging, recent labs and medical records.   Labs:     Latest Ref Rng & Units 02/11/2022   11:24 AM 07/16/2021    7:16 PM 06/11/2021   10:24 PM  CBC  WBC 4.0 - 10.5 K/uL 5.0  8.2  6.9   Hemoglobin 12.0 - 15.0 g/dL 86.1  86.5  87.3   Hematocrit 36.0 - 46.0 % 42.5  40.7  39.5   Platelets 150 - 400 K/uL 241  228  233       Latest Ref Rng & Units 02/11/2022   11:24 AM 07/16/2021    7:16 PM 06/11/2021   10:24 PM  CMP  Glucose 70 - 99 mg/dL 96  893  98   BUN 6 - 20 mg/dL 14  15  10    Creatinine 0.44 - 1.00 mg/dL 9.03  9.01  9.03   Sodium 135 - 145 mmol/L 138  135  136   Potassium 3.5 - 5.1 mmol/L 4.1  4.2  4.0   Chloride 98 - 111 mmol/L 102  103  106   CO2 22 - 32 mmol/L 26  22  25    Calcium  8.9 - 10.3 mg/dL 89.9  9.7  8.9   Total Protein 6.5 - 8.1 g/dL 7.7  7.5    Total Bilirubin 0.3 - 1.2 mg/dL 0.6  0.5    Alkaline Phos 38 - 126 U/L 50  48    AST 15 - 41 U/L 39  26    ALT 0 - 44 U/L 24  17        Imaging: Radiology review: Image noted below, no report yet readily available.  Looks like there  may be a bit of residual seroma, as noted below.   Assessment    Residual postoperative seroma following laparoscopic incisional hernia repair with mesh. Patient Active Problem List   Diagnosis Date Noted   Pregnancy as  incidental finding    Anxiety 10/09/2020   Depression 10/09/2020   Irritable bowel syndrome (IBS) 10/09/2020   Recurrent incisional hernia 07/12/2019   Tobacco abuse 07/12/2019   Morbid obesity (HCC) 07/12/2019   Abdominal pain 06/10/2017    Plan    Reassurance is given regarding the absence of a recurrent hernia.  I do not believe her symptoms will improve with any attempt at draining the small residual seroma still present.  There is no evidence of infection based on her lack of fever or chills, or for lack of palpable tenderness, and her normal white blood cell count. Face-to-face time spent with the patient and accompanying care providers(if present) was 20 minutes, with more than 50% of the time spent counseling, educating, and coordinating care of the patient.      Honor Leghorn M.D., FACS 03/03/2023, 2:28 PM

## 2023-03-03 NOTE — Patient Instructions (Signed)
 Hernia, Adult     A hernia happens when an organ or tissue inside your body pushes out through a weak spot in the muscles of your belly. This makes a bulge. The bulge may be: In a scar from surgery. This type of bulge is called an incisional hernia. Near your belly button. This type is called an umbilical hernia. In your groin, which is the area where your leg meets your lower belly. This type is called an inguinal hernia. If you're female, this type could also be in your scrotum. In your upper thigh. This type is called a femoral hernia. Inside your belly. This type is called a hiatal hernia. It happens when your stomach slides above your diaphragm, which is the muscle between your belly and your chest. What are the causes? You may get a hernia if: You lift heavy things. You cough over a long period of time. You have trouble pooping, also called constipation. Trouble pooping can lead to straining. There's a cut from surgery in your belly. You have a problem that's present at birth. There's fluid around your belly. You're female and have a testicle that hasn't moved down into your scrotum. You may be at greater risk for hernia if: You smoke. You're very overweight. What are the signs or symptoms? The main symptom is a bulge, but the bulge may not always be seen. It may grow bigger or be easier to see when you cough or strain, such as when you lift something heavy. If you can push the bulge back into your belly, it may not cause pain. If you can't push it back into your belly, it may lose its blood supply. This can cause: Pain. Fever. Nausea and vomiting. Swelling. Trouble pooping. How is this diagnosed? A hernia may be diagnosed based on your symptoms, medical history, and an exam. Your health care provider may ask you to cough or move in a way that makes the bulge easier to see. You may also have tests done. These may include: X-rays. Ultrasound. CT scan. How is this treated? A  hernia that's small and painless may not need to be treated. A hernia that's large or painful may be treated with surgery. Surgery involves pushing the bulge back into place and fixing the weak area of the muscle or belly. Follow these instructions at home: Activity Try not to strain. You may have to avoid lifting. Ask how much weight you can safely lift. If you lift something heavy, use your leg muscles. Do not use your back muscles to lift. Prevent trouble pooping You may need to take these actions to prevent or treat trouble pooping: Drink enough fluid to keep your pee (urine) pale yellow. Take medicines to help you poop. Eat foods high in fiber. These include beans, whole grains, and fresh fruits and vegetables. General instructions When you cough, try to cough gently. Try to push the bulge back in by very gently pressing on it when you're lying down. Do not try to force it back in if it won't push in easily. If you're overweight, work with your provider to lose weight safely. Do not smoke, vape, or use products with nicotine or tobacco in them. If you need help quitting, talk with your provider. If you're going to have surgery, watch your hernia for changes in shape, size, or color. Tell your provider about any changes. Contact a health care provider if: You get new pain, swelling, or redness near your hernia. You have trouble pooping.  Your poop is harder or larger than normal. You have a fever or chills. You have nausea or vomiting. Your hernia can't be pushed in. Get help right away if: You have belly pain that gets worse. Your hernia: Changes in shape or size. Changes color. Feels hard or hurts when you touch it. These symptoms may be an emergency. Call 911 right away. Do not wait to see if the symptoms will go away. Do not drive yourself to the hospital. This information is not intended to replace advice given to you by your health care provider. Make sure you discuss any  questions you have with your health care provider. Document Revised: 10/14/2022 Document Reviewed: 04/06/2022 Elsevier Patient Education  2024 [employer].

## 2023-05-03 ENCOUNTER — Telehealth: Payer: Self-pay

## 2023-05-03 NOTE — Telephone Encounter (Signed)
 Dr. Dario Guardian (PCP) states this mutual patient has advised him that she is pregnant. He would like patient to be seen sooner than 06/06/23 due to multiple HROB factors (AMA, CHTN, DM, Obesity) as well as HLD. She is on propranolol, Lipitor (must stop), Metformin, Losartan (must stop), hydrochlorothiazide. Her last BP 3 mos ago 125/80 but in past 160/95 (last year). He would like to discuss her meds with MD. Advised Dr. Lonny Prude is the on call provider today. She is in a room with a patient. We will have her return his call as soon  as possible.

## 2023-05-03 NOTE — Telephone Encounter (Signed)
 See separate telephone encounter (was already in progress when this message received).

## 2023-05-03 NOTE — Telephone Encounter (Signed)
 Attempted to call Dr. Dario Guardian at 5:00p, his office is closed and unable to leave a message. Will try again tomorrow.

## 2023-05-06 ENCOUNTER — Encounter: Payer: Self-pay | Admitting: Obstetrics and Gynecology

## 2023-05-06 ENCOUNTER — Ambulatory Visit

## 2023-05-06 VITALS — BP 123/74 | HR 64 | Wt 234.0 lb

## 2023-05-06 DIAGNOSIS — Z3689 Encounter for other specified antenatal screening: Secondary | ICD-10-CM | POA: Diagnosis not present

## 2023-05-06 DIAGNOSIS — Z3480 Encounter for supervision of other normal pregnancy, unspecified trimester: Secondary | ICD-10-CM

## 2023-05-06 NOTE — Progress Notes (Signed)
 New OB Intake  I connected with  Laura Benton on 05/06/23 at  9:15 AM EDT by in office  that I am speaking with the correct person using two identifiers. Nurse is located at [facility] and pt is located at in office .  I discussed the limitations, risks, security and privacy concerns of performing an evaluation and management service by telephone and the availability of in person appointments. I also discussed with the patient that there may be a patient responsible charge related to this service. The patient expressed understanding and agreed to proceed.  I explained I am completing New OB Intake today. We discussed her EDD of  12/07/23  unsure of lmp we are waiting on [place]  that is based on LMP of 03/02/23  is unsure maybe first week of February.  Pt is G4/P2. I reviewed her allergies, medications, Medical/Surgical/OB history, and appropriate screenings. There are cats in the home  no If yes no cats. Based on history, this is a/an pregnancy complicated by hypertension . Her obstetrical history is significant for advanced maternal age, obesity, and hypertension  Hx of c-section HX of fibroids.   Patient Active Problem List   Diagnosis Date Noted   Status post laparoscopic hernia repair 03/03/2023   Supervision of other normal pregnancy, antepartum    Anxiety 10/09/2020   Depression 10/09/2020   Irritable bowel syndrome (IBS) 10/09/2020   Tobacco abuse 07/12/2019   Morbid obesity (HCC) 07/12/2019   Abdominal pain 06/10/2017    Concerns addressed today: NO concerns    Delivery Plans:  Plans to deliver at [facility].  Anatomy US Explained scheduled [place] will be around 19 weeks.  Labs Discussed genetic screening with patient. Patient would like genetic testing to be drawn at new OB visit. Discussed possible labs to be drawn at new OB appointment.  COVID Vaccine Patient has not had COVID vaccine.   Social Determinants of Health Food Insecurity: denies food  insecurity WIC Referral: Patient is not interested in referral to [facility].  Transportation: Patient denies transportation needs. Childcare: Discussed no children allowed at ultrasound appointments.   First visit review I reviewed new OB appt with pt. I explained she will have blood work and pap smear/pelvic exam if indicated. Explained pt will be seen by Hartley Barefoot CNM at first visit; encounter routed to appropriate provider.   Loney Laurence, CMA 05/06/2023  9:32 AM

## 2023-05-08 ENCOUNTER — Encounter: Payer: Self-pay | Admitting: Certified Nurse Midwife

## 2023-05-08 DIAGNOSIS — O34219 Maternal care for unspecified type scar from previous cesarean delivery: Secondary | ICD-10-CM | POA: Insufficient documentation

## 2023-05-08 DIAGNOSIS — O09529 Supervision of elderly multigravida, unspecified trimester: Secondary | ICD-10-CM | POA: Insufficient documentation

## 2023-05-08 DIAGNOSIS — O3429 Maternal care due to uterine scar from other previous surgery: Secondary | ICD-10-CM | POA: Insufficient documentation

## 2023-05-09 ENCOUNTER — Ambulatory Visit

## 2023-05-11 ENCOUNTER — Other Ambulatory Visit: Payer: Self-pay

## 2023-05-11 ENCOUNTER — Emergency Department
Admission: EM | Admit: 2023-05-11 | Discharge: 2023-05-11 | Disposition: A | Discharge status: Home / Self Care | Attending: Emergency Medicine | Admitting: Emergency Medicine

## 2023-05-11 ENCOUNTER — Telehealth: Payer: Self-pay

## 2023-05-11 ENCOUNTER — Emergency Department

## 2023-05-11 DIAGNOSIS — O209 Hemorrhage in early pregnancy, unspecified: Secondary | ICD-10-CM | POA: Diagnosis present

## 2023-05-11 DIAGNOSIS — Z3A01 Less than 8 weeks gestation of pregnancy: Secondary | ICD-10-CM | POA: Diagnosis not present

## 2023-05-11 DIAGNOSIS — I10 Essential (primary) hypertension: Secondary | ICD-10-CM | POA: Diagnosis not present

## 2023-05-11 DIAGNOSIS — O4690 Antepartum hemorrhage, unspecified, unspecified trimester: Secondary | ICD-10-CM

## 2023-05-11 LAB — COMPREHENSIVE METABOLIC PANEL WITH GFR
ALT: 46 U/L — ABNORMAL HIGH (ref 0–44)
AST: 89 U/L — ABNORMAL HIGH (ref 15–41)
Albumin: 3.6 g/dL (ref 3.5–5.0)
Alkaline Phosphatase: 35 U/L — ABNORMAL LOW (ref 38–126)
Anion gap: 7 (ref 5–15)
BUN: 12 mg/dL (ref 6–20)
CO2: 22 mmol/L (ref 22–32)
Calcium: 8.8 mg/dL — ABNORMAL LOW (ref 8.9–10.3)
Chloride: 106 mmol/L (ref 98–111)
Creatinine, Ser: 0.83 mg/dL (ref 0.44–1.00)
GFR, Estimated: >60 mL/min (ref >60)
Glucose, Bld: 100 mg/dL — ABNORMAL HIGH (ref 70–99)
Potassium: 3.5 mmol/L (ref 3.5–5.1)
Sodium: 135 mmol/L (ref 135–145)
Total Bilirubin: 0.9 mg/dL (ref 0.0–1.2)
Total Protein: 7.2 g/dL (ref 6.5–8.1)

## 2023-05-11 LAB — CBC WITH DIFFERENTIAL/PLATELET
Abs Immature Granulocytes: 0.02 10*3/uL (ref 0.00–0.07)
Basophils Absolute: 0 10*3/uL (ref 0.0–0.1)
Basophils Relative: 1 %
Eosinophils Absolute: 0.2 10*3/uL (ref 0.0–0.5)
Eosinophils Relative: 4 %
HCT: 41.3 % (ref 36.0–46.0)
Hemoglobin: 13.8 g/dL (ref 12.0–15.0)
Immature Granulocytes: 0 %
Lymphocytes Relative: 33 %
Lymphs Abs: 1.9 10*3/uL (ref 0.7–4.0)
MCH: 30.3 pg (ref 26.0–34.0)
MCHC: 33.4 g/dL (ref 30.0–36.0)
MCV: 90.8 fL (ref 80.0–100.0)
Monocytes Absolute: 0.5 10*3/uL (ref 0.1–1.0)
Monocytes Relative: 9 %
Neutro Abs: 3 10*3/uL (ref 1.7–7.7)
Neutrophils Relative %: 53 %
Platelets: 242 10*3/uL (ref 150–400)
RBC: 4.55 MIL/uL (ref 3.87–5.11)
RDW: 12.3 % (ref 11.5–15.5)
WBC: 5.8 10*3/uL (ref 4.0–10.5)
nRBC: 0 % (ref 0.0–0.2)

## 2023-05-11 LAB — HCG, QUANTITATIVE, PREGNANCY: hCG, Beta Chain, Quant, S: 33524 m[IU]/mL — ABNORMAL HIGH (ref <5)

## 2023-05-11 LAB — ABO/RH: ABO/RH(D): O POS

## 2023-05-11 NOTE — Telephone Encounter (Signed)
 Pt calling triage with complaints of vaginal spotting that happens ONLY at work. When she's at home, no spotting. She works in a kitchen so lots of walking, denies picking up heavy things. Spotting started last week. She's at work today and spotting has increased to a small flow (states she can feel blood coming out). Has changed from a panty liner to her first pad and has not filled it up/changed it. Cramping from 1 to 10 is a 2. Has history of uterine myomectomy. I reached out to our on call provider for advise and she advised for pt to head to ER to be evaluated. Pt aware and will head there.

## 2023-05-11 NOTE — ED Provider Notes (Signed)
 [facility] Provider Note    Event Date/Time   First MD Initiated Contact with Patient 05/11/23 1204     (approximate)   History   Vaginal Bleeding   HPI  Laura Benton is a 43 y.o. female  with history of hypertension, uterine fibroids, currently pregnant G4P2 and as listed in EMR presents to the emergency department for treatment and evaluation of vaginal bleeding. LMP approximately 12/07/23. Painless vaginal bleeding started about 1 hour prior to arrival. She is not passing clots and has used 1 pad since onset. No vaginal discharge or concern for STI. No dysuria.       Physical Exam   Triage Vital Signs: ED Triage Vitals  Encounter Vitals Group     BP 05/11/23 1144 125/77     Systolic BP Percentile --      Diastolic BP Percentile --      Pulse Rate 05/11/23 1146 70     Resp 05/11/23 1144 18     Temp 05/11/23 1144 98.3 F (36.8 C)     Temp Source 05/11/23 1144 Oral     SpO2 05/11/23 1144 99 %     Weight 05/11/23 1145 233 lb 11 oz (106 kg)     Height 05/11/23 1145 5\' 5"  (1.651 m)     Head Circumference --      Peak Flow --      Pain Score 05/11/23 1145 0     Pain Loc --      Pain Education --      Exclude from Growth Chart --     Most recent vital signs: Vitals:   05/11/23 1144 05/11/23 1146  BP: 125/77   Pulse:  70  Resp: 18   Temp: 98.3 F (36.8 C)   SpO2: 99%     General: Awake, no distress.  CV:  Good peripheral perfusion.  Resp:  Normal effort.  Abd:  No distention.  Other:  Pelvic exam deferred.   ED Results / Procedures / Treatments   Labs (all labs ordered are listed, but only abnormal results are displayed) Labs Reviewed  COMPREHENSIVE METABOLIC PANEL WITH GFR - Abnormal; Notable for the following components:      Result Value   Glucose, Bld 100 (*)    Calcium 8.8 (*)    AST 89 (*)    ALT 46 (*)    Alkaline Phosphatase 35 (*)    All other components within normal limits  HCG, QUANTITATIVE,  PREGNANCY - Abnormal; Notable for the following components:   hCG, Beta Chain, Quant, S 33,524 (*)    All other components within normal limits  CBC WITH DIFFERENTIAL/PLATELET  ABO/RH     EKG  Not indicated.   RADIOLOGY  Image and radiology report reviewed and interpreted by me. Radiology report consistent with the same.  US  pending  PROCEDURES:  Critical Care performed: No  Procedures   MEDICATIONS ORDERED IN ED:  Medications - No data to display   IMPRESSION / MDM / ASSESSMENT AND PLAN / ED COURSE   I have reviewed the triage note.  Differential diagnosis includes, but is not limited to, vaginal bleeding in pregnancy, miscarriage, missed abortion  Patient's presentation is most consistent with acute complicated illness / injury requiring diagnostic workup.  43 year old female presenting to the emergency department for treatment and evaluation of vaginal bleeding in pregnancy.  See HPI for further details.  Labs show a normal CBC, CMP shows a AST of 89 and ALT of  46 with an alkaline phosphatase of 35.  Quantitative hCG is 33,524.  ABO/Rh is O+.  US  results are pending. Care transferred to [facility], PA-C who will follow up on results and determine disposition.  Clinical Course as of 05/12/23 1912  Wed May 11, 2023  1703 HCG, [name](!): 40,[phone] [JM]    Clinical Course User Index [JM] Menshew, Raye Cai, PA-C     FINAL CLINICAL IMPRESSION(S) / ED DIAGNOSES   Final diagnoses:  Vaginal bleeding in pregnancy     Rx / DC Orders   ED Discharge Orders     None        Note:  This document was prepared using Dragon voice recognition software and may include unintentional dictation errors.   [name], FNP 05/14/23 1342    Bradler, Evan K, MD 05/14/23 1655

## 2023-05-11 NOTE — Discharge Instructions (Addendum)
 Your exam and labs are normal and reassuring. Your OB ultrasound is pending at this time, as you have asked to be discharged prior to final results. You can follow your results on [facility] MyChart. Follow-up with your [facility] provider as scheduled.

## 2023-05-11 NOTE — ED Provider Notes (Signed)
-----------------------------------------   3:44 PM on 05/11/2023 -----------------------------------------  Blood pressure 125/77, pulse 70, temperature 98.3 F (36.8 C), temperature source Oral, resp. rate 18, height 5\' 5"  (1.651 m), weight 106 kg, last menstrual period 03/02/2023, SpO2 99%, unknown if currently breastfeeding.  Assuming care from [facility], PA-C/NP-C.  In short, Laura Benton is a 43 y.o. female with a chief complaint of Vaginal Bleeding .  Refer to the original H&P for additional details.  The current plan of care is to await pending ultrasound results and disposition the patient accordingly.  ____________________________________________    ED Results / Procedures / Treatments   Labs (all labs ordered are listed, but only abnormal results are displayed) Labs Reviewed  COMPREHENSIVE METABOLIC PANEL WITH GFR - Abnormal; Notable for the following components:      Result Value   Glucose, Bld 100 (*)    Calcium 8.8 (*)    AST 89 (*)    ALT 46 (*)    Alkaline Phosphatase 35 (*)    All other components within normal limits  HCG, QUANTITATIVE, PREGNANCY - Abnormal; Notable for the following components:   hCG, Beta Chain, Quant, S 33,524 (*)    All other components within normal limits  CBC WITH DIFFERENTIAL/PLATELET  ABO/RH    EKG   RADIOLOGY  I personally viewed and evaluated these images as part of my medical decision making, as well as reviewing the written report by the radiologist.  ED Provider Interpretation: single IUP noted on images  No results found.   PROCEDURES:  Critical Care performed: No  Procedures   MEDICATIONS ORDERED IN ED: Medications - No data to display   IMPRESSION / MDM / ASSESSMENT AND PLAN / ED COURSE  I reviewed the triage vital signs and the nursing notes.                              Differential diagnosis includes, but is not limited to, threatened miscarriage, incomplete miscarriage, normal bleeding  from an early trimester pregnancy, ectopic pregnancy, blighted ovum, vaginal/cervical trauma, subchorionic hemorrhage/hematoma, etc.  Patient's presentation is most consistent with acute presentation with potential threat to life or bodily function.  Patient's diagnosis is consistent with vaginal bleeding in the 1st trimester. She is a patient of [facility], presenting with scant vaginal bleeding. She denies any pelvic pain or excessive bleeding. Patient is requesting discharge prior to US  results. Patient is to follow up with [facility], as scheduled, as needed or otherwise directed. Patient is given ED precautions to return to the ED for any worsening or new symptoms.  FINAL CLINICAL IMPRESSION(S) / ED DIAGNOSES   Final diagnoses:  Vaginal bleeding in pregnancy     Rx / DC Orders   ED Discharge Orders     None        Note:  This document was prepared using Dragon voice recognition software and may include unintentional dictation errors.    May Sparks, PA-C 05/11/23 1730    Shane Darling, MD 05/11/23 Alphonso Aschoff

## 2023-05-11 NOTE — ED Triage Notes (Signed)
 Pt states vaginal bleeding that started about 1 hour ago, pt states she has a pad on but has not changed it, pt states minor ABD cramping.   Pt states she is about 3 months now, pt sees [facility].

## 2023-05-12 NOTE — Telephone Encounter (Signed)
 Reviewed u/s results with Dr. Dell Fennel.  Ok to notify patient bleeding likely from fibroid. Beta 33,524 High  .

## 2023-05-12 NOTE — Telephone Encounter (Addendum)
 TRIAGE VOICEMAIL: Patient states she went to ED yesterday. She had to leave as she had been waiting for several hours and had not gotten her results. It was after 5pm when she left. They advised her that we would have and could give her the results.

## 2023-05-20 ENCOUNTER — Telehealth: Payer: Self-pay

## 2023-05-20 NOTE — Telephone Encounter (Signed)
 Patient inquiring when gender test can be done. Advised will be done at Initial ob appointment with provider around 12 weeks.

## 2023-05-23 ENCOUNTER — Other Ambulatory Visit: Payer: Self-pay | Admitting: Certified Nurse Midwife

## 2023-05-23 ENCOUNTER — Ambulatory Visit

## 2023-05-23 DIAGNOSIS — O209 Hemorrhage in early pregnancy, unspecified: Secondary | ICD-10-CM

## 2023-05-23 DIAGNOSIS — O09521 Supervision of elderly multigravida, first trimester: Secondary | ICD-10-CM | POA: Diagnosis not present

## 2023-05-23 DIAGNOSIS — Z3A08 8 weeks gestation of pregnancy: Secondary | ICD-10-CM

## 2023-05-23 DIAGNOSIS — Z3480 Encounter for supervision of other normal pregnancy, unspecified trimester: Secondary | ICD-10-CM

## 2023-05-23 DIAGNOSIS — O3680X0 Pregnancy with inconclusive fetal viability, not applicable or unspecified: Secondary | ICD-10-CM

## 2023-05-23 DIAGNOSIS — O99211 Obesity complicating pregnancy, first trimester: Secondary | ICD-10-CM | POA: Diagnosis not present

## 2023-05-23 NOTE — Telephone Encounter (Signed)
 Telephone call with Dr. Jadali, pt's PCP, he wants us  to be aware that patient has HLD, he stopped her statin, cHTN, he stopped Losartan and she has been well-controlled on Nifedipine 30mg  daily, pre-diabetes last A1c = 5.9%, diet controlled, and some renal insufficiency, baseline Cr = 1.1 and has not yet seen nephrology.

## 2023-05-24 ENCOUNTER — Telehealth: Payer: Self-pay

## 2023-05-24 NOTE — Telephone Encounter (Signed)
 Patient calling about ultrasound results. Informed patient that everything looked normal, but you would call and confirm.

## 2023-05-26 ENCOUNTER — Other Ambulatory Visit: Payer: Self-pay | Admitting: Certified Nurse Midwife

## 2023-05-26 ENCOUNTER — Other Ambulatory Visit

## 2023-05-26 ENCOUNTER — Telehealth: Payer: Self-pay

## 2023-05-26 DIAGNOSIS — O09521 Supervision of elderly multigravida, first trimester: Secondary | ICD-10-CM

## 2023-05-26 DIAGNOSIS — Z369 Encounter for antenatal screening, unspecified: Secondary | ICD-10-CM

## 2023-05-26 DIAGNOSIS — O0990 Supervision of high risk pregnancy, unspecified, unspecified trimester: Secondary | ICD-10-CM

## 2023-05-26 MED ORDER — PROMETHAZINE HCL 12.5 MG PO TABS: 12.5000 mg | ORAL_TABLET | Freq: Four times a day (QID) | ORAL | 0 refills | Status: DC | PRN

## 2023-05-26 NOTE — Telephone Encounter (Signed)
 Patient in office for labs. She states she is on hydrochlorothiazide (HYDRODIURIL) 25 MG tablet . Her PCP is uncertain if she should stay on this medication. Discussed with Dr. Dell Fennel who advised patient should stop taking this medication. Patient advised.

## 2023-05-26 NOTE — Progress Notes (Signed)
 TC to [place], ultrasound results reviewed. SIUP, EDC change based on ultrasound to 01/02/24

## 2023-05-27 LAB — COMPREHENSIVE METABOLIC PANEL WITH GFR
ALT: 33 IU/L — ABNORMAL HIGH (ref 0–32)
AST: 54 IU/L — ABNORMAL HIGH (ref 0–40)
Albumin: 3.8 g/dL — ABNORMAL LOW (ref 3.9–4.9)
Alkaline Phosphatase: 40 IU/L — ABNORMAL LOW (ref 44–121)
BUN/Creatinine Ratio: 13 (ref 9–23)
BUN: 11 mg/dL (ref 6–24)
Bilirubin Total: 0.2 mg/dL (ref 0.0–1.2)
CO2: 22 mmol/L (ref 20–29)
Calcium: 9.2 mg/dL (ref 8.7–10.2)
Chloride: 101 mmol/L (ref 96–106)
Creatinine, Ser: 0.88 mg/dL (ref 0.57–1.00)
Globulin, Total: 2.4 g/dL (ref 1.5–4.5)
Glucose: 118 mg/dL — ABNORMAL HIGH (ref 70–99)
Potassium: 4.1 mmol/L (ref 3.5–5.2)
Sodium: 137 mmol/L (ref 134–144)
Total Protein: 6.2 g/dL (ref 6.0–8.5)
eGFR: 84 mL/min/{1.73_m2} (ref >59)

## 2023-05-27 LAB — CBC WITH DIFF/PLATELET
Basophils Absolute: 0 10*3/uL (ref 0.0–0.2)
Basos: 1 %
EOS (ABSOLUTE): 0.3 10*3/uL (ref 0.0–0.4)
Eos: 6 %
Hematocrit: 38.6 % (ref 34.0–46.6)
Hemoglobin: 12.9 g/dL (ref 11.1–15.9)
Immature Grans (Abs): 0 10*3/uL (ref 0.0–0.1)
Immature Granulocytes: 1 %
Lymphocytes Absolute: 1.8 10*3/uL (ref 0.7–3.1)
Lymphs: 36 %
MCH: 29.7 pg (ref 26.6–33.0)
MCHC: 33.4 g/dL (ref 31.5–35.7)
MCV: 89 fL (ref 79–97)
Monocytes Absolute: 0.3 10*3/uL (ref 0.1–0.9)
Monocytes: 7 %
Neutrophils Absolute: 2.5 10*3/uL (ref 1.4–7.0)
Neutrophils: 49 %
Platelets: 240 10*3/uL (ref 150–450)
RBC: 4.35 x10E6/uL (ref 3.77–5.28)
RDW: 11.9 % (ref 11.7–15.4)
WBC: 4.9 10*3/uL (ref 3.4–10.8)

## 2023-05-27 LAB — HEMOGLOBIN A1C
Est. average glucose Bld gHb Est-mCnc: 123 mg/dL
Hgb A1c MFr Bld: 5.9 % — ABNORMAL HIGH (ref 4.8–5.6)

## 2023-05-27 LAB — TSH RFX ON ABNORMAL TO FREE T4: TSH: 2.34 u[IU]/mL (ref 0.450–4.500)

## 2023-06-06 ENCOUNTER — Encounter: Admitting: Certified Nurse Midwife

## 2023-06-08 ENCOUNTER — Ambulatory Visit: Admitting: Obstetrics & Gynecology

## 2023-06-08 ENCOUNTER — Telehealth: Payer: Self-pay | Admitting: Obstetrics & Gynecology

## 2023-06-08 ENCOUNTER — Encounter: Payer: Self-pay | Admitting: Obstetrics & Gynecology

## 2023-06-08 VITALS — BP 131/70 | HR 83 | Ht 65.0 in | Wt 243.0 lb

## 2023-06-08 DIAGNOSIS — Z0130 Encounter for examination of blood pressure without abnormal findings: Secondary | ICD-10-CM

## 2023-06-08 DIAGNOSIS — O99210 Obesity complicating pregnancy, unspecified trimester: Secondary | ICD-10-CM

## 2023-06-08 NOTE — Telephone Encounter (Signed)
 Reached out to pt to schedule one hr glucose test with her NOB appt that is scheduled on 06/21/2023 at 9:55 with Binnie Buffalo.  Could not leave a message bc voicemail never picked up.

## 2023-06-08 NOTE — Progress Notes (Signed)
   Established Patient Office Visit  Subjective   Patient ID: Laura Benton, female    DOB: [birth year]  Age: 43 y.o. MRN: [id number]  Chief Complaint  Patient presents with   Blood Pressure Check    HPI   43 yo G4P2 at [redacted] weeks EGA by a 8 week dating ultrasound (not c/w her LMP EDC). She is here for a BP check . She has a h/o chtn and recently was told to stop hydrouracil about 6 weeks ago. Her primary care doc put her on procardia at that time but she reports that her to stop taking it 2 days ago. 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 Today it is 130/70.   She has a NOB visit scheduled in 2 weeks. A screening A1c recently was 5.9. She tells me that she has been "pre diabetic for 20 years".  She has had intermittent spotting for several weeks, was told that it may be due to a "bleeding fibroid". Her ultasound showed a LUS fibroid measuring 4.2 x 3.6 x 4. 6 cm 2 weeks ago. Her blood type is O+. The bleeding increased after she had sex 2 days ago.  Objective:     BP 131/70   Pulse 83   Ht 5\' 5"  (1.651 m)   Wt 243 lb (110.2 kg)   LMP 03/02/2023 (Within Weeks)   Breastfeeding No   BMI 40.44 kg/m    Physical Exam   Well nourished, well hydrated Black female, no apparent distress She is ambulating and conversing normally. Speculum exam showed small amount of dark red blood in her vault but no active bleeding from the cervix which appeared closed.  Assessment & Plan:  [redacted] weeks EGA AMA Obesity in pregnancy H/o CHTN now on no meds - follow closely, she is aware that she will likely end up taking the procardia later in the pregnancy.  I rec'd abstinence at present due to the bleeding, bleeding precautions reviewed.  Plan for NOB visit with 1 hour glucola at that visit Pr/cr ratio today  Problem List Items Addressed This Visit   None Visit Diagnoses       Obesity in pregnancy    -  Primary   Relevant Orders   Protein /  creatinine ratio, urine         Ana Balling, MD

## 2023-06-09 LAB — PROTEIN / CREATININE RATIO, URINE
Creatinine, Urine: 165.9 mg/dL
Protein, Ur: 24.5 mg/dL
Protein/Creat Ratio: 148 mg/g{creat} (ref 0–200)

## 2023-06-09 NOTE — Telephone Encounter (Signed)
 Pt has been scheduled for 1 hour glucose test on 06/21/2023 at 9:20.  The NOB appt is scheduled at 9:55 on 06/21/2023.

## 2023-06-10 ENCOUNTER — Other Ambulatory Visit: Payer: Self-pay

## 2023-06-10 DIAGNOSIS — Z3A10 10 weeks gestation of pregnancy: Secondary | ICD-10-CM

## 2023-06-10 DIAGNOSIS — O0990 Supervision of high risk pregnancy, unspecified, unspecified trimester: Secondary | ICD-10-CM

## 2023-06-12 ENCOUNTER — Emergency Department
Admission: EM | Admit: 2023-06-12 | Discharge: 2023-06-12 | Disposition: A | Discharge status: Home / Self Care | Attending: Emergency Medicine | Admitting: Emergency Medicine

## 2023-06-12 ENCOUNTER — Encounter: Payer: Self-pay | Admitting: Intensive Care

## 2023-06-12 ENCOUNTER — Emergency Department

## 2023-06-12 ENCOUNTER — Other Ambulatory Visit: Payer: Self-pay

## 2023-06-12 DIAGNOSIS — O209 Hemorrhage in early pregnancy, unspecified: Secondary | ICD-10-CM | POA: Insufficient documentation

## 2023-06-12 DIAGNOSIS — O4690 Antepartum hemorrhage, unspecified, unspecified trimester: Secondary | ICD-10-CM

## 2023-06-12 DIAGNOSIS — Z3A12 12 weeks gestation of pregnancy: Secondary | ICD-10-CM | POA: Insufficient documentation

## 2023-06-12 LAB — CBC WITH DIFFERENTIAL/PLATELET
Abs Immature Granulocytes: 0.01 10*3/uL (ref 0.00–0.07)
Basophils Absolute: 0 10*3/uL (ref 0.0–0.1)
Basophils Relative: 1 %
Eosinophils Absolute: 0.2 10*3/uL (ref 0.0–0.5)
Eosinophils Relative: 4 %
HCT: 39.4 % (ref 36.0–46.0)
Hemoglobin: 13.1 g/dL (ref 12.0–15.0)
Immature Granulocytes: 0 %
Lymphocytes Relative: 34 %
Lymphs Abs: 1.9 10*3/uL (ref 0.7–4.0)
MCH: 29 pg (ref 26.0–34.0)
MCHC: 33.2 g/dL (ref 30.0–36.0)
MCV: 87.4 fL (ref 80.0–100.0)
Monocytes Absolute: 0.5 10*3/uL (ref 0.1–1.0)
Monocytes Relative: 8 %
Neutro Abs: 3 10*3/uL (ref 1.7–7.7)
Neutrophils Relative %: 53 %
Platelets: 228 10*3/uL (ref 150–400)
RBC: 4.51 MIL/uL (ref 3.87–5.11)
RDW: 12.1 % (ref 11.5–15.5)
WBC: 5.7 10*3/uL (ref 4.0–10.5)
nRBC: 0 % (ref 0.0–0.2)

## 2023-06-12 LAB — COMPREHENSIVE METABOLIC PANEL WITH GFR
ALT: 22 U/L (ref 0–44)
AST: 27 U/L (ref 15–41)
Albumin: 3.6 g/dL (ref 3.5–5.0)
Alkaline Phosphatase: 31 U/L — ABNORMAL LOW (ref 38–126)
Anion gap: 6 (ref 5–15)
BUN: 13 mg/dL (ref 6–20)
CO2: 22 mmol/L (ref 22–32)
Calcium: 8.7 mg/dL — ABNORMAL LOW (ref 8.9–10.3)
Chloride: 107 mmol/L (ref 98–111)
Creatinine, Ser: 0.78 mg/dL (ref 0.44–1.00)
GFR, Estimated: >60 mL/min (ref >60)
Glucose, Bld: 113 mg/dL — ABNORMAL HIGH (ref 70–99)
Potassium: 4.1 mmol/L (ref 3.5–5.1)
Sodium: 135 mmol/L (ref 135–145)
Total Bilirubin: 0.9 mg/dL (ref 0.0–1.2)
Total Protein: 7.3 g/dL (ref 6.5–8.1)

## 2023-06-12 LAB — URINALYSIS, ROUTINE W REFLEX MICROSCOPIC
Bacteria, UA: NONE SEEN
Bilirubin Urine: NEGATIVE
Glucose, UA: NEGATIVE mg/dL
Ketones, ur: NEGATIVE mg/dL
Leukocytes,Ua: NEGATIVE
Nitrite: NEGATIVE
Protein, ur: NEGATIVE mg/dL
RBC / HPF: >50 RBC/hpf (ref 0–5)
Specific Gravity, Urine: 1.017 (ref 1.005–1.030)
pH: 6 (ref 5.0–8.0)

## 2023-06-12 LAB — POC URINE PREG, ED: Preg Test, Ur: POSITIVE — AB

## 2023-06-12 LAB — HCG, QUANTITATIVE, PREGNANCY: hCG, Beta Chain, Quant, S: 2622 m[IU]/mL — ABNORMAL HIGH (ref <5)

## 2023-06-12 LAB — ABO/RH: ABO/RH(D): O POS

## 2023-06-12 MED ORDER — ACETAMINOPHEN 500 MG PO TABS
500.0000 mg | ORAL_TABLET | Freq: Once | ORAL | Status: AC
  Administered 2023-06-12: 500 mg via ORAL
  Filled 2023-06-12: qty 1

## 2023-06-12 MED ORDER — ONDANSETRON 4 MG PO TBDP
4.0000 mg | ORAL_TABLET | Freq: Once | ORAL | Status: AC
  Administered 2023-06-12: 4 mg via ORAL
  Filled 2023-06-12: qty 1

## 2023-06-12 NOTE — Discharge Instructions (Addendum)
 Call [facility] to see if they have a doctor that you can see in 10 days for an ultrasound and repeat lab work.  If you are unable to get an appointment for reevaluation return to the emergency department for these test.  At this time it appears that most likely you are having a miscarriage.  Return to the emergency department if any severe worsening of your symptoms or increased bleeding.

## 2023-06-12 NOTE — ED Provider Notes (Signed)
 [facility] Provider Note    Event Date/Time   First MD Initiated Contact with Patient 06/12/23 1254     (approximate)   History   Vaginal Bleeding   HPI  Laura Benton is a 43 y.o. female   presents to the ED the with complaint of light vaginal bleeding with pressure and nausea that started today.  Patient currently is [redacted] weeks pregnant.  She has continued to have bleeding throughout her pregnancy intermittently.  She currently is being seen at [facility].  Patient has been told that she has fibroids which is causing the bleeding.  She has history of IBS, anxiety.        Physical Exam   Triage Vital Signs: ED Triage Vitals [06/12/23 1243]  Encounter Vitals Group     BP (!) 146/88     Systolic BP Percentile      Diastolic BP Percentile      Pulse Rate 66     Resp 16     Temp 98.7 F (37.1 C)     Temp Source Oral     SpO2 99 %     Weight 240 lb (108.9 kg)     Height 5\' 5"  (1.651 m)     Head Circumference      Peak Flow      Pain Score 5     Pain Loc      Pain Education      Exclude from Growth Chart     Most recent vital signs: Vitals:   06/12/23 1243  BP: (!) 146/88  Pulse: 66  Resp: 16  Temp: 98.7 F (37.1 C)  SpO2: 99%     General: Awake, no distress.  CV:  Good peripheral perfusion.  Resp:  Normal effort.  Abd:  No distention.  Other:     ED Results / Procedures / Treatments   Labs (all labs ordered are listed, but only abnormal results are displayed) Labs Reviewed  HCG, QUANTITATIVE, PREGNANCY - Abnormal; Notable for the following components:      Result Value   hCG, Beta Chain, Quant, S 2,622 (*)    All other components within normal limits  COMPREHENSIVE METABOLIC PANEL WITH GFR - Abnormal; Notable for the following components:   Glucose, Bld 113 (*)    Calcium 8.7 (*)    Alkaline Phosphatase 31 (*)    All other components within normal limits  URINALYSIS, ROUTINE W REFLEX MICROSCOPIC -  Abnormal; Notable for the following components:   Color, Urine YELLOW (*)    APPearance CLEAR (*)    Hgb urine dipstick LARGE (*)    All other components within normal limits  POC URINE PREG, ED - Abnormal; Notable for the following components:   Preg Test, Ur POSITIVE (*)    All other components within normal limits  CBC WITH DIFFERENTIAL/PLATELET  ABO/RH      RADIOLOGY Ultrasound OB less than 14 weeks transvaginal exam per radiology with no yolk sac visible, embryo probable visualized, no cardiac activity visualized with small subchorionic hemorrhage noted.  Recommended repeat ultrasound in 10 to 14 days for reevaluation.    PROCEDURES:  Critical Care performed:   Procedures   MEDICATIONS ORDERED IN ED: Medications  ondansetron  (ZOFRAN -ODT) disintegrating tablet 4 mg (4 mg Oral Given 06/12/23 1330)  acetaminophen  (TYLENOL ) tablet 500 mg (500 mg Oral Given 06/12/23 1329)     IMPRESSION / MDM / ASSESSMENT AND PLAN / ED COURSE  I reviewed the triage  vital signs and the nursing notes.   Differential diagnosis includes, but is not limited to, vaginal bleeding in first trimester pregnancy, threatened abortion, subchorionic hemorrhage, vaginal trauma.  44 year old female presents to the ED with complaint of vaginal bleeding and a history of being approximately [redacted] weeks pregnant.  ABO Rh is O+, metabolic panel and CBC unremarkable and urinalysis showed greater than 50 RBCs due to vaginal bleeding.  hCG is 2622 which is down from her prior test 4 weeks ago of 33,000.  Patient voices understanding that most likely this represents a failed pregnancy.  She currently is a patient at [facility] but states that the doctor that was there has left the practice.  She is encouraged to call the office to see if she can be seen however if not she is to return to the emergency department at which time a repeat hCG and ultrasound will be done.      Patient's presentation is most consistent  with acute complicated illness / injury requiring diagnostic workup.  FINAL CLINICAL IMPRESSION(S) / ED DIAGNOSES   Final diagnoses:  Vaginal bleeding in pregnancy     Rx / DC Orders   ED Discharge Orders     None        Note:  This document was prepared using Dragon voice recognition software and may include unintentional dictation errors.   Stafford Eagles, PA-C 06/12/23 1523    Arline Bennett, MD 06/12/23 [phone]

## 2023-06-12 NOTE — ED Notes (Signed)
 See triage note  Presents with some abd cramping with nausea .  Pt is pregnant  Also has had some vaginal bleeding   But states this is not new

## 2023-06-12 NOTE — ED Triage Notes (Signed)
 Patient is [redacted] weeks pregnant. Patient c/o light vaginal bleeding, vaginal pressure and nausea that started today.  Reports told she has fibroids and has had some spotting on and off this pregnancy

## 2023-06-21 ENCOUNTER — Encounter: Admitting: Certified Nurse Midwife

## 2023-06-21 ENCOUNTER — Telehealth: Payer: Self-pay | Admitting: Certified Nurse Midwife

## 2023-06-21 ENCOUNTER — Other Ambulatory Visit

## 2023-06-21 NOTE — Telephone Encounter (Signed)
 Reached out to pt to reschedule appts that were scheduled on 06/21/2023-9:20 one hr glucose test and 9:55 NOB appt with Binnie Buffalo.  Left message for pt to call back to reschedule.

## 2023-06-22 ENCOUNTER — Encounter: Payer: Self-pay | Admitting: Certified Nurse Midwife

## 2023-06-22 NOTE — Telephone Encounter (Signed)
 Reached out to pt (2x) to reschedule appts that were scheduled on 06/21/2023-9:20 one hr glucose test and 9:55 NOB appt with Binnie Buffalo.  Left message for pt to call back to reschedule.  Will send a MyChart letter to pt.

## 2023-06-30 ENCOUNTER — Emergency Department
Admission: EM | Admit: 2023-06-30 | Discharge: 2023-06-30 | Disposition: A | Discharge status: Home / Self Care | Attending: Emergency Medicine | Admitting: Emergency Medicine

## 2023-06-30 ENCOUNTER — Encounter: Payer: Self-pay | Admitting: Emergency Medicine

## 2023-06-30 ENCOUNTER — Other Ambulatory Visit: Payer: Self-pay

## 2023-06-30 ENCOUNTER — Emergency Department

## 2023-06-30 DIAGNOSIS — O200 Threatened abortion: Secondary | ICD-10-CM | POA: Insufficient documentation

## 2023-06-30 DIAGNOSIS — O039 Complete or unspecified spontaneous abortion without complication: Secondary | ICD-10-CM

## 2023-06-30 DIAGNOSIS — Z8759 Personal history of other complications of pregnancy, childbirth and the puerperium: Secondary | ICD-10-CM

## 2023-06-30 DIAGNOSIS — O209 Hemorrhage in early pregnancy, unspecified: Secondary | ICD-10-CM

## 2023-06-30 DIAGNOSIS — R102 Pelvic and perineal pain: Secondary | ICD-10-CM | POA: Diagnosis present

## 2023-06-30 LAB — CBC
HCT: 38.7 % (ref 36.0–46.0)
Hemoglobin: 12.5 g/dL (ref 12.0–15.0)
MCH: 28.3 pg (ref 26.0–34.0)
MCHC: 32.3 g/dL (ref 30.0–36.0)
MCV: 87.6 fL (ref 80.0–100.0)
Platelets: 198 10*3/uL (ref 150–400)
RBC: 4.42 MIL/uL (ref 3.87–5.11)
RDW: 13 % (ref 11.5–15.5)
WBC: 7.7 10*3/uL (ref 4.0–10.5)
nRBC: 0 % (ref 0.0–0.2)

## 2023-06-30 LAB — HCG, QUANTITATIVE, PREGNANCY: hCG, Beta Chain, Quant, S: 318 m[IU]/mL — ABNORMAL HIGH (ref <5)

## 2023-06-30 NOTE — Progress Notes (Unsigned)
 GYNECOLOGY PROGRESS NOTE  Subjective:  PCP: Annelle Kiel, MD  Patient ID: Laura Benton, female    DOB: [birth year], 43 y.o.   MRN: [id number]  HPI  Patient is a 43 y.o. [id number] female who presents for ED follow up for miscarriage. Pt was seen at the ER 06/12/23 and again 06/30/23 due to vaginal bleeding in pregnancy, compared to a prior SAB in 2022, this is much more painful and taking longer to finish. Reports ED told her she needed a D&C, she does not want a procedure. Blood type O+.  05/11/23: positive HCG 33,524, CRL [redacted]w[redacted]d, FHT 123 05/23/23: CRL [redacted]w[redacted]d, FHR 173; Fibroid seen, 4.2 x 3.6 x 4.6 in posterior LUS 06/08/23: Saw Dr. Everardo Hitch, was spotting 06/12/23: hCG 2,622; CRL [redacted]w[redacted]d with [facility], no cardiac activity 06/30/23: hCG 318; No IUP, no retained POCs  The following portions of the patient's history were reviewed and updated as appropriate: allergies, current medications, past family history, past medical history, past social history, past surgical history, and problem list.  Review of Systems Pertinent items are noted in HPI.   Objective:   Blood pressure 113/67, pulse 98, height 5\' 5"  (1.651 m), weight 236 lb (107 kg), last menstrual period 03/02/2023, unknown if currently breastfeeding. Body mass index is 39.27 kg/m.  General appearance: alert, cooperative, and moderately obese Abdomen: soft, non-tender; bowel sounds normal; no masses,  no organomegaly Pelvic: cervix normal in appearance, external genitalia normal, no adnexal masses or tenderness, no cervical motion tenderness, rectovaginal septum normal, uterus normal size, shape, and consistency, and vagina normal without discharge Extremities: extremities normal, atraumatic, no cyanosis or edema Neurologic: Grossly normal  06/12/23 US  PELVIC FINDINGS: Intrauterine gestational sac: Single   Yolk sac:  Not Visualized.   Embryo:  Probably visualized.   Cardiac Activity: Not Visualized.   CRL:  19.7 mm   8 w   4 d    Subchorionic hemorrhage:  Small subchorionic hemorrhage is noted.   Maternal uterus/adnexae: Ovaries are not visualized. No free fluid is noted.   IMPRESSION: Limited exam due to body habitus. Probable fetal pole is noted with crown lump length of 8 weeks 4 days. Fetal cardiac activity would be expected at this age, but it is possible it is not detected due to limitation of this exam as noted above. Small subchronic hemorrhage. Findings are suspicious but not yet definitive for failed pregnancy. Recommend follow-up US  in 10-14 days for definitive diagnosis. This recommendation follows SRU consensus guidelines: Diagnostic Criteria for Nonviable Pregnancy Early in the First Trimester. Mel Spine Med 2013; [id number].   06/30/23 US  PELVIC FINDINGS: Intrauterine gestational sac: None (a single intrauterine gestational sac was seen on the prior study.)   Yolk sac:  Not Visualized.   Embryo: Not Visualized. (A probable fetal pole was noted on the prior study.)   Cardiac Activity: Not Visualized.   Heart Rate: N/A  bpm   Maternal uterus/adnexae: The uterus measures 15.22 cm x 6.58 cm x 6.82 cm (volume 357.62 mL).   The endometrium measures 17.6 mm in thickness and is heterogeneous in appearance. No abnormal endometrial vascularity is seen on color Doppler evaluation.   The right ovary is not visualized.   The left ovary measures 3.10 cm x 2.08 cm x 3.55 cm and is normal in appearance.   IMPRESSION: 1. Findings meet definitive criteria for failed pregnancy. This follows SRU consensus guidelines: Diagnostic Criteria for Nonviable Pregnancy Early in the First Trimester. Ole [place] J Med [phone]. 2. Heterogeneous endometrium  without evidence of retained products of conception.   Assessment/Plan:   1. Incomplete miscarriage    43 y.o. [id number] currently undergoing SAB, now lightly spotting and desiring medical management for the potential retained POCs. We thoroughly  reviewed her course and most recent US  showing no evidence of POCs, but she prefers to take the medicine. Advised it may cause flushing, cramping, and may not pass much more tissue, if any. We will recheck her HCG in 1 wk. Bleeding precautions advised.   She is also s/p myomectomy in the past, and with a 4-5cm fibroid in posterior LUS on US  in April, desiring another pregnancy. We discussed that her past two losses may have been related to her age and declining egg quality, vs the known fibroid vs unknown cause. Discussed option of IVF to ensure healthy embryo, she doesn't think she can go for IVF financially. She desires another myomectomy and to conceive naturally. Will have her schedule a consult with Dr. Luster Salters to discuss surgery.    Total time was 32 minutes. That includes chart review before the visit, the actual patient visit, and time spent on documentation after the visit. Time excludes procedures, if any.    Sofia Dunn, DO [facility]

## 2023-06-30 NOTE — ED Triage Notes (Signed)
 C/O pelvic pain/cramping.  States had a miscarriage 2-21/2 weeks ago and has had cramping since that time.  States vaginal bleeding continues.  Reports small amounts of bleeding and vaginal discharge. Passing some clots.  AAOx3. Skin warm and dry. NAD

## 2023-06-30 NOTE — ED Provider Notes (Signed)
 [facility] Provider Note   Event Date/Time   First MD Initiated Contact with Patient 06/30/23 1209     (approximate) History  Pelvic Pain  HPI Laura Benton is a 43 y.o. female with a recent past medical history of miscarriage 2 weeks ago that was diagnosed in our emergency department who presents complaining of continued vaginal bleeding and suprapubic cramping since that time.  Patient has not followed up with any OB/GYN physicians.  Patient has not followed up with her PCP.  Patient has not been using any medications for this pain. ROS: Patient currently denies any vision changes, tinnitus, difficulty speaking, facial droop, sore throat, chest pain, shortness of breath, nausea/vomiting/diarrhea, dysuria, or weakness/numbness/paresthesias in any extremity   Physical Exam  Triage Vital Signs: ED Triage Vitals  Encounter Vitals Group     BP 06/30/23 1104 104/67     Systolic BP Percentile --      Diastolic BP Percentile --      Pulse Rate 06/30/23 1104 79     Resp 06/30/23 1104 16     Temp 06/30/23 1104 98.6 F (37 C)     Temp Source 06/30/23 1104 Oral     SpO2 06/30/23 1104 98 %     Weight 06/30/23 1105 238 lb 1.6 oz (108 kg)     Height --      Head Circumference --      Peak Flow --      Pain Score 06/30/23 1105 2     Pain Loc --      Pain Education --      Exclude from Growth Chart --    Most recent vital signs: Vitals:   06/30/23 1104  BP: 104/67  Pulse: 79  Resp: 16  Temp: 98.6 F (37 C)  SpO2: 98%   General: Awake, oriented x4. CV:  Good peripheral perfusion. Resp:  Normal effort. Abd:  No distention. Other:  Middle-aged obese African-American female resting comfortably in no acute distress ED Results / Procedures / Treatments  Labs (all labs ordered are listed, but only abnormal results are displayed) Labs Reviewed  HCG, QUANTITATIVE, PREGNANCY - Abnormal; Notable for the following components:      Result Value   hCG,  Beta Chain, Quant, S 318 (*)    All other components within normal limits  CBC  POC URINE PREG, ED   RADIOLOGY ED MD interpretation: OB ultrasound shows failed pregnancy with heterogeneous endometrium without evidence of retained products of conception - All radiology independently interpreted and agree with radiology assessment Official radiology report(s): US  OB LESS THAN 14 WEEKS WITH OB TRANSVAGINAL Result Date: 06/30/2023 CLINICAL DATA:  Vaginal bleeding. EXAM: OBSTETRIC <14 WK US  AND TRANSVAGINAL OB US  TECHNIQUE: Both transabdominal and transvaginal ultrasound examinations were performed for complete evaluation of the gestation as well as the maternal uterus, adnexal regions, and pelvic cul-de-sac. Transvaginal technique was performed to assess early pregnancy. COMPARISON:  Jun 12, 2023 FINDINGS: Intrauterine gestational sac: None (a single intrauterine gestational sac was seen on the prior study.) Yolk sac:  Not Visualized. Embryo: Not Visualized. (A probable fetal pole was noted on the prior study.) Cardiac Activity: Not Visualized. Heart Rate: N/A  bpm Maternal uterus/adnexae: The uterus measures 15.22 cm x 6.58 cm x 6.82 cm (volume 357.62 mL). The endometrium measures 17.6 mm in thickness and is heterogeneous in appearance. No abnormal endometrial vascularity is seen on color Doppler evaluation. The right ovary is not visualized. The left ovary measures 3.10  cm x 2.08 cm x 3.55 cm and is normal in appearance. IMPRESSION: 1. Findings meet definitive criteria for failed pregnancy. This follows SRU consensus guidelines: Diagnostic Criteria for Nonviable Pregnancy Early in the First Trimester. Ole [place] J Med [phone]. 2. Heterogeneous endometrium without evidence of retained products of conception. Electronically Signed   By: Virgle Grime M.D.   On: 06/30/2023 13:47   PROCEDURES: Critical Care performed: No Procedures MEDICATIONS ORDERED IN ED: Medications - No data to  display IMPRESSION / MDM / ASSESSMENT AND PLAN / ED COURSE  I reviewed the triage vital signs and the nursing notes.                             The patient is on the cardiac monitor to evaluate for evidence of arrhythmia and/or significant heart rate changes. Patient's presentation is most consistent with acute presentation with potential threat to life or bodily function. 43 year old female presents 2 weeks after a diagnosed inevitable miscarriage complaining of continued bleeding and suprapubic abdominal cramping Workup: CBC, bHCG, Type&Screen, 1st Trimester Ultrasound No signs of anemia, hCG downtrending Based on History, Exam, and ED Workup patient's presentation not consistent with ectopic pregnancy, molar pregnancy, life-threatening coagulopathy, trauma, serious bacterial infection, central process or other emergency. Patient does still have thickened endometrium with heterogenous tissue concerning for incomplete miscarriage and patient instructed to follow-up with OB/GYN for further management. Disposition: Will discharge home with return precautions and instruction for prompt OBGYN follow up.   FINAL CLINICAL IMPRESSION(S) / ED DIAGNOSES   Final diagnoses:  Pelvic pain in female  Miscarriage   Rx / DC Orders   ED Discharge Orders     None      Note:  This document was prepared using Dragon voice recognition software and may include unintentional dictation errors.   Linsie Lupo K, MD 06/30/23 [phone]

## 2023-07-01 ENCOUNTER — Encounter: Payer: Self-pay | Admitting: Obstetrics

## 2023-07-01 ENCOUNTER — Ambulatory Visit: Admitting: Obstetrics

## 2023-07-01 VITALS — BP 113/67 | HR 98 | Ht 65.0 in | Wt 236.0 lb

## 2023-07-01 DIAGNOSIS — O034 Incomplete spontaneous abortion without complication: Secondary | ICD-10-CM

## 2023-07-01 DIAGNOSIS — Z3A01 Less than 8 weeks gestation of pregnancy: Secondary | ICD-10-CM

## 2023-07-01 MED ORDER — MISOPROSTOL 200 MCG PO TABS: 800.0000 ug | ORAL_TABLET | ORAL | 0 refills | Status: DC

## 2023-07-01 MED ORDER — MISOPROSTOL 200 MCG PO TABS: 200.0000 ug | ORAL_TABLET | Freq: Four times a day (QID) | ORAL | 0 refills | Status: DC

## 2023-07-01 MED ORDER — OXYCODONE-ACETAMINOPHEN 5-325 MG PO TABS: 1.0000 | ORAL_TABLET | Freq: Four times a day (QID) | ORAL | 0 refills | Status: DC | PRN

## 2023-07-01 MED ORDER — IBUPROFEN 800 MG PO TABS: 800.0000 mg | ORAL_TABLET | Freq: Three times a day (TID) | ORAL | 0 refills | Status: DC | PRN

## 2023-07-01 NOTE — Patient Instructions (Addendum)
 CYTOTEC (MISOPROSTOL) INSTRUCTIONS Who should not take this medication? If you have an allergy to Cytotec  What are the side effects of the Cytotec? Menstrual cramping or contractions Vaginal bleeding-soaking a maxi pad within 1 hour is considered excessive bleeding and you should go to the emergency room for further evaluation Headache Abdominal pain Nausea or vomiting.  Small frequent meals, sucking on hard sugar-free candy, or chewing sugar-free gum may help.  Diarrhea  How do I take Cytotec? You have chosen medical management.  You will be given a prescription for Cytotec 800 mcg in four 200 mcg, 2 doses.  You will take the first dose (4 tablets) by mouth.   Your pregnancy will begin to pass in about 8 to 12 hours after placement of the tablets.  Cramping, abdominal pain, and bleeding is common.  This is how the medication works.  You may take Ibuprofen  800 mg every 8 hours for mild discomfort.  If a prescription of Percocet was given, you may also take this medication every 6 hours for moderate to severe pain.   If you do not have significant bleeding within 24 hours after placement of Cytotec, you should take the second dose (4 tablets) of medication. If you pass the pregnancy, please make sure you have a follow-up appointment with our office in 1 week.  To ensure complete passage of the pregnancy, you will have either an ultrasound or blood tests at this visit.  Expect bleeding for 1 to 3 weeks after the miscarriage fully passes, however this bleeding should be light. If your blood type is Rh negative, please ensure you receive your RhoGam injection within 72 hours of bleeding  If your bleeding soaks a maxi pad completely within 1 hour for 1-2 hours, you should go to the emergency room for further evaluation.  Cytotec is 86% effective within the first 24 hours and 94% effective within 48 hours with a repeat dose.  If you have no significant bleeding within 48 hours and have taken the two  doses, please call our office.

## 2023-07-08 ENCOUNTER — Other Ambulatory Visit

## 2023-07-08 ENCOUNTER — Encounter: Payer: Self-pay | Admitting: Obstetrics and Gynecology

## 2023-07-08 DIAGNOSIS — O034 Incomplete spontaneous abortion without complication: Secondary | ICD-10-CM

## 2023-07-09 LAB — BETA HCG QUANT (REF LAB): hCG Quant: 42 m[IU]/mL

## 2023-07-18 ENCOUNTER — Ambulatory Visit: Payer: Self-pay | Admitting: Obstetrics

## 2023-07-18 DIAGNOSIS — O039 Complete or unspecified spontaneous abortion without complication: Secondary | ICD-10-CM

## 2023-08-10 ENCOUNTER — Encounter: Payer: Self-pay | Admitting: Obstetrics and Gynecology

## 2023-08-10 ENCOUNTER — Ambulatory Visit: Admitting: Obstetrics and Gynecology

## 2023-08-10 VITALS — BP 149/89 | HR 80 | Ht 65.0 in | Wt 242.6 lb

## 2023-08-10 DIAGNOSIS — D219 Benign neoplasm of connective and other soft tissue, unspecified: Secondary | ICD-10-CM

## 2023-08-10 DIAGNOSIS — D259 Leiomyoma of uterus, unspecified: Secondary | ICD-10-CM | POA: Diagnosis not present

## 2023-08-10 DIAGNOSIS — Z8759 Personal history of other complications of pregnancy, childbirth and the puerperium: Secondary | ICD-10-CM

## 2023-08-10 NOTE — Progress Notes (Signed)
 HPI:      Ms. Laura Benton is a 43 y.o. [phone] who LMP was Patient's last menstrual period was 03/02/2023 (within weeks).  Subjective:   She presents today to discuss possible future pregnancy.  She has a history of uterine fibroids, in fact underwent fibroid removal many years ago at [facility].  An ultrasound several months ago shows a 4 cm fibroid in the lower uterine segment.  She has had 2 successful births 18 years apart.  At the age of 53 and at 57 she has now had a miscarriage.  She is inquiring about the possible cause of her miscarriage and how to best successfully get pregnant and keep her pregnancy.    Hx: The following portions of the patient's history were reviewed and updated as appropriate:             She  has a past medical history of Anemia, Anxiety, Complication of anesthesia, Difficult intubation, Elevated blood pressure reading, GERD (gastroesophageal reflux disease), Head trauma (05/2020), Heart murmur, History of 2019 novel coronavirus disease (COVID-19) (01/2020), Hypertension, Incisional hernia, without obstruction or gangrene (03/31/2018), Recurrent incisional hernia (07/12/2019), and Vaginal Pap smear, abnormal. She does not have any pertinent problems on file. She  has a past surgical history that includes right shoulder surgery (Right); Uterine fibroid surgery; Cesarean section; Tonsillectomy; Incisional hernia repair (N/A, 07/12/2018); and Incision and drainage abscess (N/A, 07/16/2020). Her family history includes Anemia in her sister; Pulmonary disease in her son; Throat cancer in her father. She  reports that she has been smoking cigars. She has never used smokeless tobacco. She reports that she does not currently use alcohol. She reports that she does not use drugs. She has a current medication list which includes the following prescription(s): hydrochlorothiazide, losartan, nifedipine, and oxycodone -acetaminophen . She has no known allergies.       Review of  Systems:  Review of Systems  Constitutional: Denied constitutional symptoms, night sweats, recent illness, fatigue, fever, insomnia and weight loss.  Eyes: Denied eye symptoms, eye pain, photophobia, vision change and visual disturbance.  Ears/Nose/Throat/Neck: Denied ear, nose, throat or neck symptoms, hearing loss, nasal discharge, sinus congestion and sore throat.  Cardiovascular: Denied cardiovascular symptoms, arrhythmia, chest pain/pressure, edema, exercise intolerance, orthopnea and palpitations.  Respiratory: Denied pulmonary symptoms, asthma, pleuritic pain, productive sputum, cough, dyspnea and wheezing.  Gastrointestinal: Denied, gastro-esophageal reflux, melena, nausea and vomiting.  Genitourinary: Denied genitourinary symptoms including symptomatic vaginal discharge, pelvic relaxation issues, and urinary complaints.  Musculoskeletal: Denied musculoskeletal symptoms, stiffness, swelling, muscle weakness and myalgia.  Dermatologic: Denied dermatology symptoms, rash and scar.  Neurologic: Denied neurology symptoms, dizziness, headache, neck pain and syncope.  Psychiatric: Denied psychiatric symptoms, anxiety and depression.  Endocrine: Denied endocrine symptoms including hot flashes and night sweats.   Meds:   Current Outpatient Medications on File Prior to Visit  Medication Sig Dispense Refill   hydrochlorothiazide (HYDRODIURIL) 25 MG tablet Take 25 mg by mouth daily.     losartan (COZAAR) 100 MG tablet Take 100 mg by mouth daily.     NIFEdipine (PROCARDIA-XL/NIFEDICAL-XL) 30 MG 24 hr tablet Take 30 mg by mouth daily. (Patient not taking: Reported on 08/10/2023)     oxyCODONE -acetaminophen  (PERCOCET/ROXICET) 5-325 MG tablet Take 1 tablet by mouth every 6 (six) hours as needed for severe pain (pain score 7-10). (Patient not taking: Reported on 08/10/2023) 12 tablet 0   No current facility-administered medications on file prior to visit.      Objective:     Vitals:  08/10/23  1109  BP: (!) 149/89  Pulse: 80   Filed Weights   08/10/23 1109  Weight: 242 lb 9.6 oz (110 kg)              Ultrasound results reviewed directly with the patient          Assessment:    [id number] Patient Active Problem List   Diagnosis Date Noted   Incomplete miscarriage 07/01/2023   Pregnancy with history of uterine myomectomy 05/08/2023   Pregnancy with history of cesarean section, antepartum 05/08/2023   Advanced maternal age in multigravida 05/08/2023   Status post laparoscopic hernia repair 03/03/2023   Anxiety 10/09/2020   Depression 10/09/2020   Irritable bowel syndrome (IBS) 10/09/2020   Tobacco abuse 07/12/2019   Morbid obesity (HCC) 07/12/2019   Abdominal pain 06/10/2017     1. Fibroid   2. History of miscarriage     I discussed with her the possibility that the fibroid is causing her miscarriages.  I specifically discussed chromosomal problems that cause approximately 50% of spontaneous miscarriage.  We have discussed advanced maternal age and decreased fertility.   Plan:            1.  I believe the best option for her is to consult reproductive endocrinology infertility to decide on her best course of action.  This may be a uterine fibroid embolization.  This may be in vitro.  But because time is of the essence I believe a brief consultation with them regarding best strategy should be employed.  Laura Benton understands this rationale.   Literature on uterine fibroid embolization given.    Orders Orders Placed This Encounter  Procedures   Ambulatory referral to Infertility    No orders of the defined types were placed in this encounter.     F/U  No follow-ups on file.  Alm DOROTHA Sar, M.D. 08/10/2023 11:53 AM

## 2023-09-23 ENCOUNTER — Ambulatory Visit: Admitting: Cardiology

## 2023-10-12 ENCOUNTER — Emergency Department
Admission: EM | Admit: 2023-10-12 | Discharge: 2023-10-12 | Disposition: A | Discharge status: Home / Self Care | Attending: Emergency Medicine | Admitting: Emergency Medicine

## 2023-10-12 ENCOUNTER — Other Ambulatory Visit: Payer: Self-pay

## 2023-10-12 ENCOUNTER — Emergency Department

## 2023-10-12 DIAGNOSIS — M25511 Pain in right shoulder: Secondary | ICD-10-CM | POA: Diagnosis present

## 2023-10-12 DIAGNOSIS — X501XXA Overexertion from prolonged static or awkward postures, initial encounter: Secondary | ICD-10-CM | POA: Insufficient documentation

## 2023-10-12 MED ORDER — HYDROCODONE-ACETAMINOPHEN 5-325 MG PO TABS: 1.0000 | ORAL_TABLET | Freq: Four times a day (QID) | ORAL | 0 refills | Status: AC | PRN

## 2023-10-12 MED ORDER — ACETAMINOPHEN 325 MG PO TABS: 650.0000 mg | ORAL_TABLET | Freq: Four times a day (QID) | ORAL | 2 refills | Status: AC | PRN

## 2023-10-12 MED ORDER — MELOXICAM 15 MG PO TABS
15.0000 mg | ORAL_TABLET | Freq: Every day | ORAL | 0 refills | Status: AC
Start: 2023-10-12 — End: 2023-10-26

## 2023-10-12 MED ORDER — HYDROCHLOROTHIAZIDE 25 MG PO TABS: 25.0000 mg | ORAL_TABLET | Freq: Every day | ORAL | 2 refills | Status: DC

## 2023-10-12 NOTE — Discharge Instructions (Addendum)
 The x-ray of your shoulder today did not show any fractures or current dislocation.  Please wear the shoulder sling for the next week.  Perform the shoulder range of motion exercises attached to the discharge paperwork daily.  Please follow-up with orthopedics as information is attached.  Call to schedule.  You can take 650 mg of Tylenol  every 6 hours as needed for pain. You can use ice, heat, muscle creams and other topical pain relievers as well.  Please take the meloxicam  (Mobic ) once a day for 2 weeks.  This is an anti-inflammatory.  Do not take other NSAIDs while taking this medication.  NSAIDs include ibuprofen , Motrin , Advil , naproxen, Aleve, celecoxib , and Celebrex .  I have sent a strong pain medication called Norco to the pharmacy.  This medication can be taken every 4-6 hours as needed for severe or breakthrough pain.  This medication can cause dependency so only take if you are unable to control your pain with other medications.  It will make you sleepy so do not drive or operate heavy machinery after taking it.  Do not drink alcohol while taking this medication.  This medication can also cause constipation so please take an over-the-counter stool softener like MiraLAX or Colace while taking it.  This medication contains both hydrocodone  and acetaminophen .  Do not take Tylenol  at the same time.  You can take the Norco or Tylenol  but not both.

## 2023-10-12 NOTE — ED Triage Notes (Signed)
 Pt reports her R shoulder dislocated last night. Pt hx of R shoulder dislocation. Pt reports she felt like she pushed it back in last night but still reporting a lot of pain with ROM. GCS 15, Ambulatory in triage

## 2023-10-12 NOTE — ED Provider Notes (Signed)
 [facility] Provider Note    Event Date/Time   First MD Initiated Contact with Patient 10/12/23 [phone]     (approximate)   History   Shoulder Pain   HPI  Laura Benton is a 43 y.o. female with PMH of obesity, IBS, anxiety, depression presents for evaluation of right shoulder pain.  Patient endorses previous right shoulder injury.  She states that she fell yesterday and dislocated her shoulder.  She was able to get back in the joint but is having significant pain in her right arm.      Physical Exam   Triage Vital Signs: ED Triage Vitals  Encounter Vitals Group     BP 10/12/23 0814 135/83     Girls Systolic BP Percentile --      Girls Diastolic BP Percentile --      Boys Systolic BP Percentile --      Boys Diastolic BP Percentile --      Pulse Rate 10/12/23 0814 76     Resp 10/12/23 0814 18     Temp 10/12/23 0814 98.2 F (36.8 C)     Temp Source 10/12/23 0814 Oral     SpO2 10/12/23 0814 96 %     Weight 10/12/23 0814 249 lb (112.9 kg)     Height 10/12/23 0814 5' 4 (1.626 m)     Head Circumference --      Peak Flow --      Pain Score 10/12/23 0818 5     Pain Loc --      Pain Education --      Exclude from Growth Chart --     Most recent vital signs: Vitals:   10/12/23 0814  BP: 135/83  Pulse: 76  Resp: 18  Temp: 98.2 F (36.8 C)  SpO2: 96%   General: Awake, no distress.  CV:  Good peripheral perfusion.  Resp:  Normal effort.  Abd:  No distention.  R Shoulder:  Mild tenderness to palpation of the joint, unable to perform any range of motion due to pain, patient has maintained sensation and strength in the hand, able to give a thumbs up, cross index and middle finger, give okay sign and perform thumb opposition with each finger.    ED Results / Procedures / Treatments   Labs (all labs ordered are listed, but only abnormal results are displayed) Labs Reviewed - No data to display    RADIOLOGY  Right shoulder x-ray  obtained, interpreted the images as well as reviewed the radiologist report which was negative for acute abnormalities.  PROCEDURES:  Critical Care performed: No  Procedures   MEDICATIONS ORDERED IN ED: Medications - No data to display   IMPRESSION / MDM / ASSESSMENT AND PLAN / ED COURSE  I reviewed the triage vital signs and the nursing notes.                             43 year old female presents for evaluation of right shoulder pain.  Vital signs are stable patient NAD on exam.  Differential diagnosis includes, but is not limited to, shoulder dislocation, shoulder fracture, rotator cuff tear, muscle strain.  Patient's presentation is most consistent with acute complicated illness / injury requiring diagnostic workup.  X-ray of right shoulder does not show any fractures or dislocation.  Will place patient in an arm sling and advised her to follow-up with orthopedics.  Discussed pain medication options including meloxicam , Tylenol  and  Norco.  Will send prescriptions for these.  She was given a note to return to work tomorrow.  Will give her some shoulder range of motion exercises to work on. Patient voiced understanding, all questions were answered and she was stable at discharge. Clinical Course as of 10/12/23 0932  Wed Oct 12, 2023  0932 At discharge, patient requesting refill of hydrochlorothiazide  as she does not currently have a primary care provider.  Prescription was sent to her pharmacy. [LD]    Clinical Course User Index [LD] Cleaster Tinnie LABOR, PA-C     FINAL CLINICAL IMPRESSION(S) / ED DIAGNOSES   Final diagnoses:  Acute pain of right shoulder     Rx / DC Orders   ED Discharge Orders          Ordered    HYDROcodone -acetaminophen  (NORCO/VICODIN) 5-325 MG tablet  Every 6 hours PRN        10/12/23 0921    meloxicam  (MOBIC ) 15 MG tablet  Daily        10/12/23 0921    acetaminophen  (TYLENOL ) 325 MG tablet  Every 6 hours PRN        10/12/23 9078              Note:  This document was prepared using Dragon voice recognition software and may include unintentional dictation errors.   Cleaster Tinnie LABOR, PA-C 10/12/23 9076    Jacolyn Pae, MD 10/12/23 1537

## 2023-11-22 DIAGNOSIS — M75120 Complete rotator cuff tear or rupture of unspecified shoulder, not specified as traumatic: Secondary | ICD-10-CM | POA: Insufficient documentation

## 2023-11-22 DIAGNOSIS — M75101 Unspecified rotator cuff tear or rupture of right shoulder, not specified as traumatic: Secondary | ICD-10-CM | POA: Insufficient documentation

## 2023-11-30 ENCOUNTER — Ambulatory Visit: Admitting: Family

## 2023-11-30 ENCOUNTER — Encounter: Payer: Self-pay | Admitting: Family

## 2023-11-30 VITALS — BP 124/83 | HR 83 | Ht 65.0 in | Wt 256.6 lb

## 2023-11-30 DIAGNOSIS — Z720 Tobacco use: Secondary | ICD-10-CM | POA: Diagnosis not present

## 2023-11-30 DIAGNOSIS — I10 Essential (primary) hypertension: Secondary | ICD-10-CM | POA: Diagnosis not present

## 2023-11-30 DIAGNOSIS — F3289 Other specified depressive episodes: Secondary | ICD-10-CM | POA: Diagnosis not present

## 2023-11-30 DIAGNOSIS — Z1211 Encounter for screening for malignant neoplasm of colon: Secondary | ICD-10-CM | POA: Insufficient documentation

## 2023-11-30 DIAGNOSIS — Z1239 Encounter for other screening for malignant neoplasm of breast: Secondary | ICD-10-CM | POA: Insufficient documentation

## 2023-11-30 DIAGNOSIS — E559 Vitamin D deficiency, unspecified: Secondary | ICD-10-CM | POA: Insufficient documentation

## 2023-11-30 DIAGNOSIS — R7303 Prediabetes: Secondary | ICD-10-CM | POA: Insufficient documentation

## 2023-11-30 DIAGNOSIS — F419 Anxiety disorder, unspecified: Secondary | ICD-10-CM

## 2023-11-30 DIAGNOSIS — K588 Other irritable bowel syndrome: Secondary | ICD-10-CM | POA: Diagnosis not present

## 2023-11-30 DIAGNOSIS — R5383 Other fatigue: Secondary | ICD-10-CM | POA: Insufficient documentation

## 2023-11-30 DIAGNOSIS — Z1231 Encounter for screening mammogram for malignant neoplasm of breast: Secondary | ICD-10-CM | POA: Insufficient documentation

## 2023-11-30 DIAGNOSIS — E782 Mixed hyperlipidemia: Secondary | ICD-10-CM | POA: Insufficient documentation

## 2023-11-30 DIAGNOSIS — E538 Deficiency of other specified B group vitamins: Secondary | ICD-10-CM | POA: Insufficient documentation

## 2023-11-30 DIAGNOSIS — F1010 Alcohol abuse, uncomplicated: Secondary | ICD-10-CM | POA: Insufficient documentation

## 2023-11-30 MED ORDER — BUPROPION HCL ER (XL) 150 MG PO TB24: 150.0000 mg | ORAL_TABLET | ORAL | 2 refills | Status: AC

## 2023-11-30 NOTE — Assessment & Plan Note (Addendum)
-   PHQ-9 and GAD-7 recorded today. Patient undergoing increased stress. Recommended patient to start Wellbutrin 150 mg daily.

## 2023-11-30 NOTE — Progress Notes (Signed)
 New Patient Office Visit  Subjective  Patient ID: Laura Benton, Laura Benton    DOB: 08-20-1980  Age: 43 y.o. MRN: [id number]  CC:  Chief Complaint  Patient presents with   Establish Care    NPE    HPI Laura Benton presents to establish care Previous Primary Care provider/office:  Dr. Jadali  she does have additional concerns to discuss today.   Patient is here today to establish care with office as her PCP. She reports she is over all doing well but does have concerns to discuss today. Patient has PMH that includes: IBS, depression, anxiety, HTN, prediabetes, HLD.   She has complaints today that includes: She has been seen recently by orthopedics for right shoulder rotator cuff tear. She is scheduled for surgery in December. She has been wearing a sling as recommended until her surgery.  She has complaints of her weight and has not had success with diet and exercise. She has tried Wegovy in the past but her insurance does not cover weight loss medications. Patient is interested in getting gastric bypass surgery. Discussed gastric bypass surgery as an option. Recommended patient to complete blood work prior to sending referral.  Patient is due for colon cancer screening. Discussed colonoscopy verses cologuard. Patient would like to proceed with colonoscopy. GI referral sent. Patient is also due for mammogram; will order to be completed. Patient's PHQ-9 score 1; GAD- 7 score 5. Will start Wellbutrin 150 mg daily.  Her blood pressure is looking good today. She reports she has not taken her medications today. Recommend patient to continue her medications at this time will adjust dosages after getting a couple readings and reviewing labs.  She is due for routine blood work and will collect Monday when she returns fasting. Patient states she is smoking less than 1 cigar a week. Patient endorses drinking 3- 8 oz cans of beer a day. Discussed drinking cessation and patient  reports she will reduce her intake of alcohol. Reinforced the need for her to stop smoking and drinking completely 2 weeks prior to her surgery.   She declines wanting a flu shot today.    Outpatient Encounter Medications as of 11/30/2023  Medication Sig   acetaminophen  (TYLENOL ) 325 MG tablet Take 2 tablets (650 mg total) by mouth every 6 (six) hours as needed for moderate pain (pain score 4-6).   buPROPion (WELLBUTRIN XL) 150 MG 24 hr tablet Take 1 tablet (150 mg total) by mouth every morning.   hydrochlorothiazide  (HYDRODIURIL ) 25 MG tablet Take 1 tablet (25 mg total) by mouth daily.   losartan (COZAAR) 100 MG tablet Take 100 mg by mouth daily.   [DISCONTINUED] NIFEdipine (PROCARDIA-XL/NIFEDICAL-XL) 30 MG 24 hr tablet Take 30 mg by mouth daily. (Patient not taking: Reported on 11/30/2023)   No facility-administered encounter medications on file as of 11/30/2023.    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  2000, 2019   INCISION AND DRAINAGE ABSCESS N/A 07/16/2020   Procedure: drain placement, drainage of post operative seroma;  Surgeon: Lane Shope, MD;  Location: [facility] ORS;  Service: General;  Laterality: N/A;   INCISIONAL HERNIA REPAIR N/A 07/12/2018   Procedure: LAPAROSCOPIC INCISIONAL ABDOMINAL WALL HERNIA REPAIR;  Surgeon: Nicholaus Selinda Birmingham, MD;  Location: [facility];  Service: General;  Laterality: N/A;   right shoulder surgery Right    tear of rotator cuff.   TONSILLECTOMY     UTERINE FIBROID SURGERY      Family History  Problem Relation Age of Onset   Throat cancer Father    Anemia Sister    Pulmonary disease Son     Social History   Socioeconomic History   Marital status: Widowed    Spouse name: jospeh tyler   Number of children: Not on file   Years of education: Not on file   Highest education level: Not on file  Occupational History   Occupation: [employer]  Tobacco Use   Smoking status: Some Days    Types: Cigars   Smokeless tobacco: Never   Tobacco comments:    black and milds  Vaping Use   Vaping status: Never Used  Substance and Sexual Activity   Alcohol use: Not Currently    Comment: beer/liqour qd   Drug use: Never   Sexual activity: Yes    Birth control/protection: None  Other Topics Concern   Not on file  Social History Narrative   Patient lives alone with youngest child. Feels safe in her home.   Not sure of who will bring her to the hospital or who can stay with her.      Patient does not want to receive any blood products.  Will have her sign       Refusal of blood papers.   Social Drivers of Corporate Investment Banker Strain: Low Risk  (05/06/2023)   Overall Financial Resource Strain (CARDIA)    Difficulty of Paying Living Expenses: Not hard at all  Food Insecurity: No Food Insecurity (05/06/2023)   Hunger Vital Sign    Worried About Running Out of Food in the Last Year: Never true    Ran Out of Food in the Last Year: Never true  Transportation Needs: No Transportation Needs (05/06/2023)   PRAPARE - Administrator, Civil Service (Medical): No    Lack of Transportation (Non-Medical): No  Physical Activity: Sufficiently Active (05/06/2023)   Exercise Vital Sign    Days of Exercise per Week: 7 days    Minutes of Exercise per Session: 60 min   Stress: No Stress Concern Present (05/06/2023)   [employer] of Occupational Health - Occupational Stress Questionnaire    Feeling of Stress : Only a little  Social Connections: Socially Isolated (05/06/2023)   Social Connection and Isolation Panel    Frequency of Communication with Friends and Family: More than three times a week    Frequency of Social Gatherings with Friends and Family: More than three times a week    Attends Religious Services: Never    Database Administrator or Organizations: No    Attends Banker Meetings: Never    Marital Status: Widowed  Intimate Partner Violence: Not At Risk (03/18/2022)   Received  from [facility]   Humiliation, Afraid, Rape, and Kick questionnaire    Within the last year, have you been afraid of your partner or ex-partner?: No    Within the last year, have you been humiliated or emotionally abused in other ways by your partner or ex-partner?: No    Within the last year, have you been kicked, hit, slapped, or otherwise physically hurt by your partner or ex-partner?: No    Within the last year, have you been raped or forced to have any kind of sexual activity by your partner or ex-partner?: No    Review of Systems  Constitutional:  Positive for malaise/fatigue.  HENT: Negative.    Eyes:  Negative for blurred vision and pain.  Respiratory:  Negative for cough and shortness of breath.   Cardiovascular:  Negative for chest pain, palpitations, claudication and leg swelling.  Gastrointestinal:  Negative for abdominal pain, blood in stool, constipation, diarrhea, nausea and vomiting.  Genitourinary:  Negative for dysuria, frequency and urgency.  Musculoskeletal:  Positive for back pain (lumbar; right leg reports throbbing pain intermittent) and joint pain (right should rotator cuff).  Skin: Negative.   Neurological:  Negative for dizziness, tingling, sensory change and headaches.  Endo/Heme/Allergies: Negative.    Psychiatric/Behavioral:  The patient is nervous/anxious.         Objective   BP 124/83   Pulse 83   Ht 5' 5 (1.651 m)   Wt 256 lb 9.6 oz (116.4 kg)   SpO2 96%   BMI 42.70 kg/m   Physical Exam Vitals and nursing note reviewed.  Constitutional:      Appearance: Normal appearance.  HENT:     Head: Normocephalic.  Eyes:     Extraocular Movements: Extraocular movements intact.     Pupils: Pupils are equal, round, and reactive to light.  Cardiovascular:     Rate and Rhythm: Normal rate and regular rhythm.     Pulses: Normal pulses.     Heart sounds: Normal heart sounds. No murmur heard. Pulmonary:     Effort: Pulmonary effort is normal. No respiratory distress.     Breath sounds: Normal breath sounds.  Abdominal:     General: There is no distension.     Tenderness: There is no abdominal tenderness.  Musculoskeletal:        General: No tenderness. Normal range of motion.     Cervical back: Normal range of motion and neck supple.     Right lower leg: No edema.     Left lower leg: No edema.  Skin:    General: Skin is warm and dry.     Coloration: Skin is not jaundiced.     Findings: No erythema.  Neurological:     General: No focal deficit present.     Mental Status: She is alert and oriented to person, place, and time.  Psychiatric:        Mood and Affect: Mood normal.        Speech: Speech normal.        Behavior: Behavior is cooperative.        Cognition and Memory: Memory is not impaired.       Assessment & Plan Tobacco abuse - Recommend smoking cessation.  Other irritable bowel syndrome - Patient denies any symptoms at this time.  Primary hypertension Mixed hyperlipidemia Prediabetes Morbid obesity (HCC) - Continue healthy diet and exercise as tolerated. - Continue medications as prescribed. - Check labs today   Other depression Anxiety  Other fatigue - PHQ-9 and GAD-7 recorded today. Patient undergoing increased stress. Recommended patient to  start Wellbutrin 150 mg daily.  B12 deficiency Vitamin D deficiency - Check labs today - Supplementation recommended based off lab results and will notify patient at that time  Colon cancer screening Screening mammogram for breast cancer - Gi referral sent. - Mammogram ordered to be completed.  Return in about 2 weeks (around 12/14/2023).   Total time spent: 45 minutes  Oddis DELENA Cain, FNP  11/30/2023  This document may have been prepared by [facility] Voice Recognition software and as such may include unintentional dictation errors.

## 2023-11-30 NOTE — Assessment & Plan Note (Addendum)
-   Gi referral sent. - Mammogram ordered to be completed.

## 2023-11-30 NOTE — Assessment & Plan Note (Addendum)
-   Continue healthy diet and exercise as tolerated. - Continue medications as prescribed. - Check labs today

## 2023-11-30 NOTE — Assessment & Plan Note (Addendum)
 Recommend smoking cessation.

## 2023-11-30 NOTE — Assessment & Plan Note (Addendum)
Patient denies any symptoms at this time

## 2023-11-30 NOTE — Assessment & Plan Note (Addendum)
-   Check labs today - Supplementation recommended based off lab results and will notify patient at that time

## 2023-12-12 ENCOUNTER — Other Ambulatory Visit

## 2023-12-13 ENCOUNTER — Ambulatory Visit: Payer: Self-pay

## 2023-12-13 DIAGNOSIS — E559 Vitamin D deficiency, unspecified: Secondary | ICD-10-CM

## 2023-12-13 DIAGNOSIS — E782 Mixed hyperlipidemia: Secondary | ICD-10-CM

## 2023-12-13 LAB — FOLATE RBC
Folate, Hemolysate: 454 ng/mL
Folate, RBC: 1102 ng/mL (ref >498)
Hematocrit: 41.2 % (ref 34.0–46.6)

## 2023-12-13 LAB — CBC WITH DIFFERENTIAL/PLATELET
Basophils Absolute: 0 x10E3/uL (ref 0.0–0.2)
Basos: 1 %
EOS (ABSOLUTE): 0.2 x10E3/uL (ref 0.0–0.4)
Eos: 4 %
Hematocrit: 42.1 % (ref 34.0–46.6)
Hemoglobin: 13.1 g/dL (ref 11.1–15.9)
Immature Grans (Abs): 0 x10E3/uL (ref 0.0–0.1)
Immature Granulocytes: 0 %
Lymphocytes Absolute: 2 x10E3/uL (ref 0.7–3.1)
Lymphs: 40 %
MCH: 29.4 pg (ref 26.6–33.0)
MCHC: 31.1 g/dL — ABNORMAL LOW (ref 31.5–35.7)
MCV: 94 fL (ref 79–97)
Monocytes Absolute: 0.4 x10E3/uL (ref 0.1–0.9)
Monocytes: 9 %
Neutrophils Absolute: 2.2 x10E3/uL (ref 1.4–7.0)
Neutrophils: 46 %
Platelets: 210 x10E3/uL (ref 150–450)
RBC: 4.46 x10E6/uL (ref 3.77–5.28)
RDW: 13.7 % (ref 11.7–15.4)
WBC: 4.9 x10E3/uL (ref 3.4–10.8)

## 2023-12-13 LAB — LIPID PANEL W/O CHOL/HDL RATIO
Cholesterol, Total: 207 mg/dL — ABNORMAL HIGH (ref 100–199)
HDL: 61 mg/dL (ref >39)
LDL Chol Calc (NIH): 126 mg/dL — ABNORMAL HIGH (ref 0–99)
Triglycerides: 110 mg/dL (ref 0–149)
VLDL Cholesterol Cal: 20 mg/dL (ref 5–40)

## 2023-12-13 LAB — CMP14+EGFR
ALT: 20 IU/L (ref 0–32)
AST: 37 IU/L (ref 0–40)
Albumin: 3.9 g/dL (ref 3.9–4.9)
Alkaline Phosphatase: 48 IU/L (ref 41–116)
BUN/Creatinine Ratio: 12 (ref 9–23)
BUN: 13 mg/dL (ref 6–24)
Bilirubin Total: 0.4 mg/dL (ref 0.0–1.2)
CO2: 21 mmol/L (ref 20–29)
Calcium: 8.6 mg/dL — ABNORMAL LOW (ref 8.7–10.2)
Chloride: 103 mmol/L (ref 96–106)
Creatinine, Ser: 1.08 mg/dL — ABNORMAL HIGH (ref 0.57–1.00)
Globulin, Total: 2.4 g/dL (ref 1.5–4.5)
Glucose: 87 mg/dL (ref 70–99)
Potassium: 4.3 mmol/L (ref 3.5–5.2)
Sodium: 138 mmol/L (ref 134–144)
Total Protein: 6.3 g/dL (ref 6.0–8.5)
eGFR: 65 mL/min/1.73 (ref >59)

## 2023-12-13 LAB — HEMOGLOBIN A1C
Est. average glucose Bld gHb Est-mCnc: 117 mg/dL
Hgb A1c MFr Bld: 5.7 % — ABNORMAL HIGH (ref 4.8–5.6)

## 2023-12-13 LAB — TSH+T4F+T3FREE
Free T4: 0.98 ng/dL (ref 0.82–1.77)
T3, Free: 2.6 pg/mL (ref 2.0–4.4)
TSH: 2.48 u[IU]/mL (ref 0.450–4.500)

## 2023-12-13 LAB — VITAMIN D 25 HYDROXY (VIT D DEFICIENCY, FRACTURES): Vit D, 25-Hydroxy: 9.1 ng/mL — ABNORMAL LOW (ref 30.0–100.0)

## 2023-12-13 LAB — VITAMIN B12: Vitamin B-12: 361 pg/mL (ref 232–1245)

## 2023-12-13 MED ORDER — ROSUVASTATIN CALCIUM 5 MG PO TABS: 5.0000 mg | ORAL_TABLET | Freq: Every day | ORAL | 3 refills | Status: AC

## 2023-12-13 MED ORDER — VITAMIN D (ERGOCALCIFEROL) 1.25 MG (50000 UNIT) PO CAPS: 50000.0000 [IU] | ORAL_CAPSULE | ORAL | 1 refills | Status: AC

## 2023-12-14 NOTE — Progress Notes (Signed)
 Left message to return call

## 2023-12-19 ENCOUNTER — Ambulatory Visit: Admitting: Family

## 2023-12-20 ENCOUNTER — Emergency Department
Admission: EM | Admit: 2023-12-20 | Discharge: 2023-12-20 | Discharge status: Against Medical Advice | Attending: Emergency Medicine | Admitting: Emergency Medicine

## 2023-12-20 ENCOUNTER — Other Ambulatory Visit: Payer: Self-pay

## 2023-12-20 DIAGNOSIS — M25561 Pain in right knee: Secondary | ICD-10-CM | POA: Insufficient documentation

## 2023-12-20 DIAGNOSIS — Z5321 Procedure and treatment not carried out due to patient leaving prior to being seen by health care provider: Secondary | ICD-10-CM | POA: Insufficient documentation

## 2023-12-20 NOTE — ED Triage Notes (Signed)
 C/O right knee pain and swelling. Denies injury. Onset this morning.

## 2024-01-02 ENCOUNTER — Ambulatory Visit: Admitting: Family

## 2024-01-27 ENCOUNTER — Other Ambulatory Visit: Payer: Self-pay

## 2024-01-27 NOTE — Telephone Encounter (Signed)
 Pt called requesting refill, stated she is completely out of rx- please advise

## 2024-01-30 MED ORDER — LOSARTAN POTASSIUM 100 MG PO TABS: 100.0000 mg | ORAL_TABLET | Freq: Every day | ORAL | 1 refills | Status: DC

## 2024-02-22 ENCOUNTER — Other Ambulatory Visit: Payer: Self-pay | Admitting: Family

## 2024-02-22 MED ORDER — HYDROCHLOROTHIAZIDE 25 MG PO TABS: 25.0000 mg | ORAL_TABLET | Freq: Every day | ORAL | 1 refills | Status: AC

## 2024-02-22 MED ORDER — LOSARTAN POTASSIUM 100 MG PO TABS: 100.0000 mg | ORAL_TABLET | Freq: Every day | ORAL | 1 refills | Status: AC

## 2024-02-29 ENCOUNTER — Encounter: Payer: Self-pay | Admitting: Family

## 2024-02-29 ENCOUNTER — Ambulatory Visit: Admitting: Family

## 2024-02-29 VITALS — BP 114/72 | HR 93 | Ht 65.0 in | Wt 258.2 lb

## 2024-02-29 DIAGNOSIS — I10 Essential (primary) hypertension: Secondary | ICD-10-CM

## 2024-02-29 DIAGNOSIS — M25562 Pain in left knee: Secondary | ICD-10-CM | POA: Insufficient documentation

## 2024-02-29 DIAGNOSIS — F419 Anxiety disorder, unspecified: Secondary | ICD-10-CM

## 2024-02-29 DIAGNOSIS — E538 Deficiency of other specified B group vitamins: Secondary | ICD-10-CM

## 2024-02-29 DIAGNOSIS — E782 Mixed hyperlipidemia: Secondary | ICD-10-CM

## 2024-02-29 DIAGNOSIS — Z6841 Body Mass Index (BMI) 40.0 and over, adult: Secondary | ICD-10-CM

## 2024-02-29 DIAGNOSIS — E559 Vitamin D deficiency, unspecified: Secondary | ICD-10-CM

## 2024-02-29 DIAGNOSIS — R7303 Prediabetes: Secondary | ICD-10-CM

## 2024-02-29 MED ORDER — MELOXICAM 15 MG PO TABS: 15.0000 mg | ORAL_TABLET | Freq: Every day | ORAL | 2 refills | Status: AC

## 2024-02-29 MED ORDER — SEMAGLUTIDE-WEIGHT MANAGEMENT 0.5 MG/0.5ML ~~LOC~~ SOAJ: 0.5000 mg | SUBCUTANEOUS | 0 refills | Status: AC

## 2024-02-29 NOTE — Assessment & Plan Note (Signed)
-   Check labs when fasting - Supplementation recommended based off lab results and will notify patient at that time

## 2024-02-29 NOTE — Assessment & Plan Note (Signed)
-   Start Meloxicam  15 mg daily. Educated to avoid other NSAID use. Can take Tylenol  as needed. Recommended RICE/ conservative therapy. - Patient wants to see [facility] for xray and potential treatment. - Reinforced healthy diet and exercise as tolerated. - Continue medications as prescribed. - Check labs when fasting. Add on Lipo A. Discussed benefits of stain therapy and patient willing to start medication if cholesterol remains elevated. - Start Wegovy  once weekly injection as prescribed. PA as needed.

## 2024-02-29 NOTE — Patient Instructions (Addendum)
 Laura Benton

## 2024-02-29 NOTE — Assessment & Plan Note (Signed)
-   Continue medications as prescribed. FU if symptoms worsen or she has thoughts of suicide ideation, self harm or harm of others. - Discussed healthy coping mechanisms and stress reduction.

## 2024-02-29 NOTE — Progress Notes (Unsigned)
 "  Established Patient Office Visit  Subjective:  Patient ID: Laura Benton, female    DOB: [birth year]  Age: 44 y.o. MRN: [id number]  Chief Complaint  Patient presents with   Acute Visit    Left knee swelling started 2-3 weeks ago, pain when walking, knee has also been popping. PT said it could have something to do with weight.    Patient is here today for her follow up.  She has been feeling fairly well since last appointment.   She does have additional concerns to discuss today. Sprained left knee 11/25 and went to Urgent care. Xray was performed and there was no abnormal findings, no sign of degenerative changes at that time. Now she is having swelling; reports the pain never resolved and has progressively gotten worse. Patient denies redness, warmth, decreased ROM. Denies any falls, injury to her left knee. Will send in Meloxicam  to take daily to reduce inflammation and help with pain. Patient is well established with [facility] and states she would rather go through them for xrays and treatment to prevent delay of treatment as needed and waiting on a referral to be seen after xrays and conservative therapy has been trialed. Recommend RICE therapy and using brace or ace wrap to provide stabilization and compression to the left knee.  Labs are due today but patient is not fasting. She will return for fasting labs. She does not need refills at this time. Patient reports she never started Rosuvastatin  5 mg daily. She states  do I need this medication.  Discussed benefit of statins and reviewed patients personal and family hx. Discussed starting medication if LDL is elevated and lipo A is elevated. Patient would like to loose weight to reduce stress on joints, improve hypertension and reduce risk associated with elevated BMI. Patient wants to try injectables. Will start Wegovy  0.25 mg weekly injection. Provided patient with sample box. Reinforced healthy diet, drinking plenty of water  daily, and exercise as tolerated.  I have reviewed her active problem list, medication list, allergies, family history, social history, health maintenance, notes from last encounter, lab results for her appointment today.      No other concerns at this time.   Past Medical History:  Diagnosis Date   Anemia    Anxiety    no meds   Colon cancer screening 11/30/2023   Complication of anesthesia    delayed emergence   Difficult intubation    Elevated blood pressure reading    GERD (gastroesophageal reflux disease)    no meds   Head trauma 05/2020   staple in scalp to repair laceration   Heart murmur    early 20's   History of 2019 novel coronavirus disease (COVID-19) 01/2020   Hypertension    Incisional hernia, without obstruction or gangrene 03/31/2018   Mixed hyperlipidemia 11/30/2023   Recurrent incisional hernia 07/12/2019   Vaginal Pap smear, abnormal     Past Surgical History:  Procedure Laterality Date   CESAREAN SECTION     x2.  2000, 2019   INCISION AND DRAINAGE ABSCESS N/A 07/16/2020   Procedure: drain placement, drainage of post operative seroma;  Surgeon: Lane Shope, MD;  Location: [facility] ORS;  Service: General;  Laterality: N/A;   INCISIONAL HERNIA REPAIR N/A 07/12/2018   Procedure: LAPAROSCOPIC INCISIONAL ABDOMINAL WALL HERNIA REPAIR;  Surgeon: Nicholaus Selinda Birmingham, MD;  Location: [facility];  Service: General;  Laterality: N/A;   right shoulder surgery Right    tear of rotator cuff.  TONSILLECTOMY     UTERINE FIBROID SURGERY      Social History   Socioeconomic History   Marital status: Widowed    Spouse name: jospeh tyler   Number of children: Not on file   Years of education: Not on file   Highest education level: Not on file  Occupational History   Occupation: [employer]  Tobacco Use   Smoking status: Some Days    Types: Cigars   Smokeless tobacco: Never   Tobacco comments:    black and milds  Vaping Use   Vaping status: Never  Used  Substance and Sexual Activity   Alcohol use: Not Currently    Comment: beer/liqour qd   Drug use: Never   Sexual activity: Yes    Birth control/protection: None  Other Topics Concern   Not on file  Social History Narrative   Patient lives alone with youngest child. Feels safe in her home.   Not sure of who will bring her to the hospital or who can stay with her.      Patient does not want to receive any blood products.  Will have her sign       Refusal of blood papers.   Social Drivers of Health   Tobacco Use: High Risk (02/29/2024)   Patient History    Smoking Tobacco Use: Some Days    Smokeless Tobacco Use: Never    Passive Exposure: Not on file  Financial Resource Strain: Low Risk (05/06/2023)   Overall Financial Resource Strain (CARDIA)    Difficulty of Paying Living Expenses: Not hard at all  Food Insecurity: No Food Insecurity (05/06/2023)   Hunger Vital Sign    Worried About Running Out of Food in the Last Year: Never true    Ran Out of Food in the Last Year: Never true  Transportation Needs: No Transportation Needs (05/06/2023)   PRAPARE - Administrator, Civil Service (Medical): No    Lack of Transportation (Non-Medical): No  Physical Activity: Sufficiently Active (05/06/2023)   Exercise Vital Sign    Days of Exercise per Week: 7 days    Minutes of Exercise per Session: 60 min  Stress: No Stress Concern Present (05/06/2023)   [employer] of Occupational Health - Occupational Stress Questionnaire    Feeling of Stress : Only a little  Social Connections: Socially Isolated (05/06/2023)   Social Connection and Isolation Panel    Frequency of Communication with Friends and Family: More than three times a week    Frequency of Social Gatherings with Friends and Family: More than three times a week    Attends Religious Services: Never    Database Administrator or Organizations: No    Attends Banker Meetings: Never    Marital Status:  Widowed  Intimate Partner Violence: Not At Risk (03/18/2022)   Received from [facility]   Epic    Within the last year, have you been afraid of your partner or ex-partner?: No    Within the last year, have you been humiliated or emotionally abused in other ways by your partner or ex-partner?: No    Within the last year, have you been kicked, hit, slapped, or otherwise physically hurt by your partner or ex-partner?: No    Within the last year, have you been raped or forced to have any kind of sexual activity by your partner or ex-partner?: No  Depression (PHQ2-9): Low Risk (11/30/2023)   Depression (PHQ2-9)  PHQ-2 Score: 1  Alcohol Screen: Low Risk (05/06/2023)   Alcohol Screen    Last Alcohol Screening Score (AUDIT): 0  Housing: High Risk (05/06/2023)   Housing Stability Vital Sign    Unable to Pay for Housing in the Last Year: No    Number of Times Moved in the Last Year: 2    Homeless in the Last Year: No  Utilities: Not At Risk (05/06/2023)   [facility] Utilities    Threatened with loss of utilities: No  Health Literacy: Adequate Health Literacy (05/06/2023)   B1300 Health Literacy    Frequency of need for help with medical instructions: Never    Family History  Problem Relation Age of Onset   Throat cancer Father    Anemia Sister    Pulmonary disease Son     Allergies[1]  Review of Systems  Constitutional:  Negative for malaise/fatigue.  HENT: Negative.    Eyes:  Negative for blurred vision and pain.  Respiratory:  Negative for cough and shortness of breath.   Cardiovascular:  Negative for chest pain, palpitations, claudication and leg swelling.  Gastrointestinal:  Negative for abdominal pain, blood in stool, constipation, diarrhea, nausea and vomiting.  Genitourinary:  Negative for dysuria, frequency and urgency.  Musculoskeletal:  Positive for joint pain (left knee).  Skin: Negative.   Neurological:  Negative for dizziness, tingling, sensory change and headaches.   Endo/Heme/Allergies: Negative.   Psychiatric/Behavioral: Negative.         Objective:   BP 114/72   Pulse 93   Ht 5' 5 (1.651 m)   Wt 258 lb 3.2 oz (117.1 kg)   SpO2 97%   BMI 42.97 kg/m   Vitals:   02/29/24 1307  BP: 114/72  Pulse: 93  Height: 5' 5 (1.651 m)  Weight: 258 lb 3.2 oz (117.1 kg)  SpO2: 97%  BMI (Calculated): 42.97    Physical Exam Vitals and nursing note reviewed.  Constitutional:      Appearance: Normal appearance.  HENT:     Head: Normocephalic.  Eyes:     Extraocular Movements: Extraocular movements intact.     Pupils: Pupils are equal, round, and reactive to light.  Cardiovascular:     Rate and Rhythm: Normal rate and regular rhythm.     Pulses: Normal pulses.     Heart sounds: Normal heart sounds. No murmur heard. Pulmonary:     Effort: Pulmonary effort is normal. No respiratory distress.     Breath sounds: Normal breath sounds.  Abdominal:     General: There is no distension.     Tenderness: There is no abdominal tenderness.  Musculoskeletal:        General: No tenderness. Normal range of motion.     Cervical back: Normal range of motion and neck supple.     Right knee: Normal.     Left knee: Swelling present. No erythema or crepitus. Normal range of motion. No tenderness.     Right lower leg: No edema.     Left lower leg: No edema.  Skin:    General: Skin is warm and dry.     Coloration: Skin is not jaundiced.     Findings: No erythema.  Neurological:     General: No focal deficit present.     Mental Status: She is alert and oriented to person, place, and time.  Psychiatric:        Mood and Affect: Mood normal.        Speech: Speech normal.  Behavior: Behavior is cooperative.        Cognition and Memory: Memory is not impaired.      No results found for any visits on 02/29/24.  Recent Results (from the past 2160 hours)  CBC with Diff     Status: Abnormal   Collection Time: 12/12/23  8:26 AM  Result Value Ref Range    WBC 4.9 3.4 - 10.8 x10E3/uL   RBC 4.46 3.77 - 5.28 x10E6/uL   Hemoglobin 13.1 11.1 - 15.9 g/dL   Hematocrit 57.8 65.9 - 46.6 %   MCV 94 79 - 97 fL   MCH 29.4 26.6 - 33.0 pg   MCHC 31.1 (L) 31.5 - 35.7 g/dL   RDW 86.2 88.2 - 84.5 %   Platelets 210 150 - 450 x10E3/uL   Neutrophils 46 Not Estab. %   Lymphs 40 Not Estab. %   Monocytes 9 Not Estab. %   Eos 4 Not Estab. %   Basos 1 Not Estab. %   Neutrophils Absolute 2.2 1.4 - 7.0 x10E3/uL   Lymphocytes Absolute 2.0 0.7 - 3.1 x10E3/uL   Monocytes Absolute 0.4 0.1 - 0.9 x10E3/uL   EOS (ABSOLUTE) 0.2 0.0 - 0.4 x10E3/uL   Basophils Absolute 0.0 0.0 - 0.2 x10E3/uL   Immature Granulocytes 0 Not Estab. %   Immature Grans (Abs) 0.0 0.0 - 0.1 x10E3/uL  CMP14+EGFR     Status: Abnormal   Collection Time: 12/12/23  8:26 AM  Result Value Ref Range   Glucose 87 70 - 99 mg/dL   BUN 13 6 - 24 mg/dL   Creatinine, Ser 8.91 (H) 0.57 - 1.00 mg/dL   eGFR 65 >40 fO/fpw/8.26   BUN/Creatinine Ratio 12 9 - 23   Sodium 138 134 - 144 mmol/L   Potassium 4.3 3.5 - 5.2 mmol/L   Chloride 103 96 - 106 mmol/L   CO2 21 20 - 29 mmol/L   Calcium  8.6 (L) 8.7 - 10.2 mg/dL   Total Protein 6.3 6.0 - 8.5 g/dL   Albumin 3.9 3.9 - 4.9 g/dL   Globulin, Total 2.4 1.5 - 4.5 g/dL   Bilirubin Total 0.4 0.0 - 1.2 mg/dL   Alkaline Phosphatase 48 41 - 116 IU/L   AST 37 0 - 40 IU/L   ALT 20 0 - 32 IU/L  Lipid Panel w/o Chol/HDL Ratio     Status: Abnormal   Collection Time: 12/12/23  8:26 AM  Result Value Ref Range   Cholesterol, Total 207 (H) 100 - 199 mg/dL   Triglycerides 889 0 - 149 mg/dL   HDL 61 >60 mg/dL   VLDL Cholesterol Cal 20 5 - 40 mg/dL   LDL Chol Calc (NIH) 873 (H) 0 - 99 mg/dL  Hemoglobin J8r     Status: Abnormal   Collection Time: 12/12/23  8:26 AM  Result Value Ref Range   Hgb A1c MFr Bld 5.7 (H) 4.8 - 5.6 %    Comment:          Prediabetes: 5.7 - 6.4          Diabetes: >6.4          Glycemic control for adults with diabetes: <7.0    Est. average  glucose Bld gHb Est-mCnc 117 mg/dL  [id number]+[id number]     Status: None   Collection Time: 12/12/23  8:26 AM  Result Value Ref Range   TSH 2.480 0.450 - 4.500 uIU/mL   T3, Free 2.6 2.0 - 4.4 pg/mL   Free T4 0.98  0.82 - 1.77 ng/dL  Vitamin B12     Status: None   Collection Time: 12/12/23  8:26 AM  Result Value Ref Range   Vitamin B-12 361 232 - 1,245 pg/mL  Vitamin D  (25 hydroxy)     Status: Abnormal   Collection Time: 12/12/23  8:26 AM  Result Value Ref Range   Vit D, 25-Hydroxy 9.1 (L) 30.0 - 100.0 ng/mL    Comment: Vitamin D  deficiency has been defined by the Institute of Medicine and an Endocrine Society practice guideline as a level of serum 25-OH vitamin D  less than 20 ng/mL (1,2). The Endocrine Society went on to further define vitamin D  insufficiency as a level between 21 and 29 ng/mL (2). 1. IOM (Institute of Medicine). 2010. Dietary reference    intakes for calcium  and D. [place]: The    [employer]. 2. Holick MF, Binkley Conway, Bischoff-Ferrari HA, et al.    Evaluation, treatment, and prevention of vitamin D     deficiency: an Endocrine Society clinical practice    guideline. JCEM. 2011 Jul; 96(7):1911-30.   RBC Folate     Status: None   Collection Time: 12/12/23  8:27 AM  Result Value Ref Range   Folate, Hemolysate 454.0 Not Estab. ng/mL   Hematocrit 41.2 34.0 - 46.6 %   Folate, RBC 1,102 >498 ng/mL       Assessment & Plan:   Assessment & Plan Primary hypertension Mixed hyperlipidemia Morbid obesity with body mass index (BMI) of 40.0 to 44.9 in adult [facility]) Acute pain of left knee - Start Meloxicam  15 mg daily. Educated to avoid other NSAID use. Can take Tylenol  as needed. Recommended RICE/ conservative therapy. - Patient wants to see [facility] for xray and potential treatment. - Reinforced healthy diet and exercise as tolerated. - Continue medications as prescribed. - Check labs when fasting. Add on Lipo A. Discussed benefits of stain therapy  and patient willing to start medication if cholesterol remains elevated. - Start Wegovy  once weekly injection as prescribed. PA as needed.  B12 deficiency Vitamin D  deficiency Prediabetes - Check labs when fasting. - Supplementation recommended based off lab results and will notify patient at that time  Anxiety - Continue medications as prescribed. FU if symptoms worsen or she has thoughts of suicide ideation, self harm or harm of others. - Discussed healthy coping mechanisms and stress reduction.     Return in about 4 weeks (around 03/28/2024).   Total time spent: 30 minutes  Oddis DELENA Cain, FNP  02/29/2024   This document may have been prepared by [facility] Voice Recognition software and as such may include unintentional dictation errors.     [1] No Known Allergies  "
